# Patient Record
Sex: Male | Born: 1945 | Race: White | Hispanic: No | Marital: Married | Smoking: Former smoker
Health system: Southern US, Community
[De-identification: ages and names within clinical notes are randomized; demographics above are authoritative.]

## PROBLEM LIST (undated history)

## (undated) DIAGNOSIS — H6990 Unspecified Eustachian tube disorder, unspecified ear: Secondary | ICD-10-CM

## (undated) DIAGNOSIS — N5089 Other specified disorders of the male genital organs: Secondary | ICD-10-CM

## (undated) DIAGNOSIS — D696 Thrombocytopenia, unspecified: Secondary | ICD-10-CM

## (undated) DIAGNOSIS — I4510 Unspecified right bundle-branch block: Secondary | ICD-10-CM

## (undated) DIAGNOSIS — Z8546 Personal history of malignant neoplasm of prostate: Secondary | ICD-10-CM

## (undated) DIAGNOSIS — J301 Allergic rhinitis due to pollen: Secondary | ICD-10-CM

## (undated) DIAGNOSIS — F419 Anxiety disorder, unspecified: Secondary | ICD-10-CM

## (undated) DIAGNOSIS — K219 Gastro-esophageal reflux disease without esophagitis: Secondary | ICD-10-CM

## (undated) DIAGNOSIS — M1990 Unspecified osteoarthritis, unspecified site: Secondary | ICD-10-CM

## (undated) DIAGNOSIS — H1010 Acute atopic conjunctivitis, unspecified eye: Secondary | ICD-10-CM

## (undated) DIAGNOSIS — Z9989 Dependence on other enabling machines and devices: Secondary | ICD-10-CM

## (undated) DIAGNOSIS — G4733 Obstructive sleep apnea (adult) (pediatric): Secondary | ICD-10-CM

## (undated) DIAGNOSIS — H6980 Other specified disorders of Eustachian tube, unspecified ear: Secondary | ICD-10-CM

## (undated) DIAGNOSIS — R059 Cough, unspecified: Secondary | ICD-10-CM

## (undated) DIAGNOSIS — R05 Cough: Secondary | ICD-10-CM

## (undated) DIAGNOSIS — K589 Irritable bowel syndrome without diarrhea: Secondary | ICD-10-CM

## (undated) HISTORY — PX: PROSTATECTOMY: SHX69

## (undated) HISTORY — DX: Other specified disorders of Eustachian tube, unspecified ear: H69.80

## (undated) HISTORY — DX: Allergic rhinitis due to pollen: J30.1

## (undated) HISTORY — DX: Acute atopic conjunctivitis, unspecified eye: H10.10

## (undated) HISTORY — DX: Personal history of malignant neoplasm of prostate: Z85.46

## (undated) HISTORY — PX: OTHER SURGICAL HISTORY: SHX169

## (undated) HISTORY — DX: Gastro-esophageal reflux disease without esophagitis: K21.9

## (undated) HISTORY — DX: Unspecified eustachian tube disorder, unspecified ear: H69.90

---

## 1999-04-17 ENCOUNTER — Other Ambulatory Visit: Admission: RE | Admit: 1999-04-17 | Discharge: 1999-04-17 | Payer: Self-pay | Admitting: Urology

## 1999-05-21 ENCOUNTER — Encounter (INDEPENDENT_AMBULATORY_CARE_PROVIDER_SITE_OTHER): Payer: Self-pay

## 1999-05-21 ENCOUNTER — Inpatient Hospital Stay (HOSPITAL_COMMUNITY): Admission: RE | Admit: 1999-05-21 | Discharge: 1999-05-24 | Payer: Self-pay | Admitting: Urology

## 2000-01-21 ENCOUNTER — Encounter: Payer: Self-pay | Admitting: General Surgery

## 2000-01-24 ENCOUNTER — Encounter (INDEPENDENT_AMBULATORY_CARE_PROVIDER_SITE_OTHER): Payer: Self-pay | Admitting: Specialist

## 2000-01-24 ENCOUNTER — Observation Stay (HOSPITAL_COMMUNITY): Admission: RE | Admit: 2000-01-24 | Discharge: 2000-01-25 | Payer: Self-pay | Admitting: General Surgery

## 2000-01-24 HISTORY — PX: LAPAROSCOPIC CHOLECYSTECTOMY: SUR755

## 2001-05-28 ENCOUNTER — Ambulatory Visit (HOSPITAL_COMMUNITY): Admission: RE | Admit: 2001-05-28 | Discharge: 2001-05-28 | Payer: Self-pay | Admitting: *Deleted

## 2004-03-12 ENCOUNTER — Encounter (INDEPENDENT_AMBULATORY_CARE_PROVIDER_SITE_OTHER): Payer: Self-pay | Admitting: *Deleted

## 2004-03-12 ENCOUNTER — Ambulatory Visit (HOSPITAL_COMMUNITY): Admission: RE | Admit: 2004-03-12 | Discharge: 2004-03-12 | Payer: Self-pay | Admitting: *Deleted

## 2004-11-21 ENCOUNTER — Ambulatory Visit: Payer: Self-pay | Admitting: Internal Medicine

## 2005-03-19 ENCOUNTER — Ambulatory Visit: Payer: Self-pay | Admitting: Internal Medicine

## 2005-07-18 ENCOUNTER — Ambulatory Visit: Payer: Self-pay | Admitting: Internal Medicine

## 2005-08-16 ENCOUNTER — Ambulatory Visit: Payer: Self-pay | Admitting: Internal Medicine

## 2005-11-13 ENCOUNTER — Ambulatory Visit: Payer: Self-pay | Admitting: Internal Medicine

## 2006-03-19 ENCOUNTER — Ambulatory Visit: Payer: Self-pay | Admitting: Internal Medicine

## 2006-07-02 ENCOUNTER — Ambulatory Visit: Payer: Self-pay | Admitting: Internal Medicine

## 2006-08-15 ENCOUNTER — Ambulatory Visit: Payer: Self-pay | Admitting: Internal Medicine

## 2006-10-30 ENCOUNTER — Ambulatory Visit: Payer: Self-pay | Admitting: Internal Medicine

## 2007-02-24 ENCOUNTER — Ambulatory Visit: Payer: Self-pay | Admitting: Internal Medicine

## 2007-06-13 DIAGNOSIS — H6980 Other specified disorders of Eustachian tube, unspecified ear: Secondary | ICD-10-CM | POA: Insufficient documentation

## 2007-06-13 DIAGNOSIS — I491 Atrial premature depolarization: Secondary | ICD-10-CM | POA: Insufficient documentation

## 2007-06-13 DIAGNOSIS — J301 Allergic rhinitis due to pollen: Secondary | ICD-10-CM | POA: Insufficient documentation

## 2007-06-13 DIAGNOSIS — C61 Malignant neoplasm of prostate: Secondary | ICD-10-CM | POA: Insufficient documentation

## 2007-06-13 DIAGNOSIS — H1045 Other chronic allergic conjunctivitis: Secondary | ICD-10-CM | POA: Insufficient documentation

## 2007-06-13 DIAGNOSIS — Z9079 Acquired absence of other genital organ(s): Secondary | ICD-10-CM | POA: Insufficient documentation

## 2007-06-13 DIAGNOSIS — G4733 Obstructive sleep apnea (adult) (pediatric): Secondary | ICD-10-CM | POA: Insufficient documentation

## 2007-06-15 ENCOUNTER — Ambulatory Visit: Payer: Self-pay | Admitting: Internal Medicine

## 2007-08-14 ENCOUNTER — Ambulatory Visit: Payer: Self-pay | Admitting: Internal Medicine

## 2007-10-07 ENCOUNTER — Ambulatory Visit: Payer: Self-pay | Admitting: Internal Medicine

## 2008-01-20 ENCOUNTER — Ambulatory Visit: Payer: Self-pay | Admitting: Internal Medicine

## 2008-05-05 ENCOUNTER — Ambulatory Visit: Payer: Self-pay | Admitting: Internal Medicine

## 2008-08-12 ENCOUNTER — Ambulatory Visit: Payer: Self-pay | Admitting: Internal Medicine

## 2008-08-12 DIAGNOSIS — K219 Gastro-esophageal reflux disease without esophagitis: Secondary | ICD-10-CM | POA: Insufficient documentation

## 2008-08-15 ENCOUNTER — Telehealth: Payer: Self-pay | Admitting: Internal Medicine

## 2008-08-16 ENCOUNTER — Ambulatory Visit: Payer: Self-pay | Admitting: Internal Medicine

## 2008-12-21 ENCOUNTER — Ambulatory Visit: Payer: Self-pay | Admitting: Internal Medicine

## 2009-05-01 ENCOUNTER — Ambulatory Visit: Payer: Self-pay | Admitting: Internal Medicine

## 2009-08-10 ENCOUNTER — Ambulatory Visit: Payer: Self-pay | Admitting: Internal Medicine

## 2009-08-29 ENCOUNTER — Ambulatory Visit: Payer: Self-pay | Admitting: Internal Medicine

## 2009-12-20 ENCOUNTER — Ambulatory Visit: Payer: Self-pay | Admitting: Internal Medicine

## 2010-04-25 ENCOUNTER — Ambulatory Visit: Payer: Self-pay | Admitting: Internal Medicine

## 2010-08-09 ENCOUNTER — Ambulatory Visit: Payer: Self-pay | Admitting: Internal Medicine

## 2010-08-28 ENCOUNTER — Ambulatory Visit: Payer: Self-pay | Admitting: Internal Medicine

## 2010-10-02 NOTE — Assessment & Plan Note (Signed)
Summary: rov 1 yr ///kp   Primary Renda Pohlman/Referring Charliegh Vasudevan:  Renne Crigler  CC:  Yearly follow up visit-allergies; no complaints..  History of Present Illness:  08/12/08 -  Allergic rhinitis/conjunctivitis, OSA Once or twice daily needs benadryl to stop seasonal rhinitis not adequately responsive to Allegra. Discussed alternatives.Never asthma.Occ minor self limited cough. On nexium.  August 10, 2009-  allergic rhinits/ conjunctivitis, OSA He had to resume flonase with seasonal rhinitis. He continues allergy vaccine. We discussed as needed use of Alllegra. He continues excellent compliance and control with CPAP at 5, which helps. Denies stuffiness and says he's fine today.    August 09, 2010  allergic rhinits/ conjunctivitis, OSA Nurse-CC: Yearly follow up visit-allergies; no complaints. He is satisfied with the way he has done over the past year. It has helped to use flonase more regularly. Denies major health events in past year.  He continues to use CPAP every night with good control. Dryness of the season does give a little nasal crusting.    Preventive Screening-Counseling & Management  Alcohol-Tobacco     Smoking Status: quit     Passive Smoke Exposure: no  Current Medications (verified): 1)  Flonase 50 Mcg/act  Susp (Fluticasone Propionate) .... As Needed 2)  Allegra 60 Mg  Tabs (Fexofenadine Hcl) .... Use As Needed Two Times A Day 3)  Epipen 2-Pak 0.3 Mg/0.89ml (1:1000)  Devi (Epinephrine Hcl (Anaphylaxis)) .... As Needed For Allergic Reaction 4)  Cpap - 5 Cwp 5)  Allergy Vaccine 1:10 Go (W-E) 6)  Toprol Xl 50 Mg Xr24h-Tab (Metoprolol Succinate) .... Take 1 By Mouth Once Daily 7)  Lipitor 10 Mg Tabs (Atorvastatin Calcium) .... Take 1 By Mouth Once Daily 8)  Nexium 40 Mg Cpdr (Esomeprazole Magnesium) .... Take 1 By Mouth Once Daily 9)  Cephalexin 500 Mg Caps (Cephalexin) .Marland Kitchen.. 1 Four Times A Day 10)  Zoloft 100 Mg Tabs (Sertraline Hcl) .... Take 1 By Mouth Once Daily With  A 50mg  Tablet 11)  Zoloft 50 Mg Tabs (Sertraline Hcl) .... Take 1 By Mouth Once Daily 12)  Aspirin 81 Mg Tbec (Aspirin) .... Take 1 By Mouth Once Daily 13)  Fish Oil 1000 Mg Caps (Omega-3 Fatty Acids) .... Take 3 By Mouth Once Daily  Allergies (verified): 1)  ! * Vantin 2)  ! Darvocet 3)  ! Floxin 4)  ! 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  Quit 03-14-69. Negative history of passive tobacco smoke exposure.  Exercise-6 days week Caffeine-no ETOH-1-2 daily Married with 3 children.  Risk Factors: Smoking Status: quit (08/09/2010) Passive Smoke Exposure: no (08/09/2010)  Review of Systems      See HPI  The patient denies shortness of breath with activity, shortness of breath at rest, productive cough, non-productive cough, coughing up blood, chest pain, irregular heartbeats, acid heartburn, indigestion, loss of appetite, weight change, abdominal pain, difficulty swallowing, sore throat, tooth/dental problems, headaches, nasal congestion/difficulty breathing through nose, and sneezing.    Vital Signs:  Patient profile:  65 year old male Weight:      203.50 pounds O2 Sat:      97 % on Room air Pulse rate:   61 / minute BP sitting:   104 / 62  (left arm) Cuff size:   regular  Vitals Entered By: Reynaldo Minium CMA (August 09, 2010 10:20 AM)  O2 Flow:  Room air CC:  Yearly follow up visit-allergies; no complaints.   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 We had update discussion of risk and benefit.  Problem # 2:  OBSTRUCTIVE SLEEP APNEA (ICD-327.23) His compliance and control with CPAP.  Medications Added to Medication List This Visit: 1)  Cpap - 5 Cwp [facility]  2)  Nexium 40 Mg Cpdr (Esomeprazole magnesium) .... Take 1 by mouth once daily 3)  Aspirin 81 Mg Tbec (Aspirin) .... Take 1 by mouth once daily 4)  Fish Oil 1000 Mg Caps (Omega-3 fatty acids) .... Take 3 by mouth once daily  Other Orders: Est. Patient Level IV (95284)  Patient Instructions: 1)  Please schedule a follow-up appointment in 1 year. 2)  Scripts sent to [facility] Prescriptions: CEPHALEXIN 500 MG CAPS (CEPHALEXIN) 1 four times a day  #28 x 1   Entered by:   Boone Master CNA/MA   Authorized by:   Waymon Budge MD   Signed by:   Boone Master CNA/MA on 08/09/2010   Method used:   Electronically to        [facility] [phone][facility] (retail)       [address]       [place], [place]  [address]       Ph: [phone] or [phone]       Fax: [phone]   RxID:   [id number] EPIPEN 2-PAK 0.3 MG/0.3ML (1:1000)  DEVI (EPINEPHRINE HCL (ANAPHYLAXIS)) as needed FOR ALLERGIC REACTION  #1 x prn   Entered by:   Boone Master CNA/MA   Authorized by:   Waymon Budge MD   Signed by:    Boone Master CNA/MA on 08/09/2010   Method used:   Electronically to        [facility] [phone]* (retail)       [address]       [place], [place]  [address]       Ph: [phone] or [phone]       Fax: [phone]   RxID:   [id number] FLONASE 50 MCG/ACT  SUSP (FLUTICASONE PROPIONATE) as needed  #1 x prn   Entered by:   Boone Master CNA/MA   Authorized by:   Waymon Budge MD   Signed by:   Boone Master CNA/MA on 08/09/2010   Method used:   Electronically to        [facility] [phone]* (retail)       [address]       [place], [place]  [address]  Ph: [phone] or [phone]       Fax: [phone]   RxID:   [id number] CEPHALEXIN 500 MG CAPS (CEPHALEXIN) 1 four times a day  #28 x 1   Entered and Authorized by:   Waymon Budge MD   Signed by:   Waymon Budge MD on 08/09/2010   Method used:   Electronically to        [facility] [phone]* (retail)       [address]       [place], [place]  [address]       Ph: [phone] or [phone]       Fax: [phone]   RxID:   [id number] FLONASE 50 MCG/ACT  SUSP (FLUTICASONE PROPIONATE) as needed  #1 x prn   Entered and Authorized by:   Waymon Budge MD   Signed by:   Waymon Budge MD on 08/09/2010   Method used:   Electronically to        [facility] [phone]* (retail)       [address]       [place], [place]  [address]       Ph: [phone] or [phone]       Fax: [phone]   RxID:   [id number] EPIPEN 2-PAK 0.3 MG/0.3ML (1:1000)  DEVI (EPINEPHRINE HCL (ANAPHYLAXIS)) as needed FOR ALLERGIC REACTION  #1 x prn   Entered and Authorized by:   Waymon Budge MD   Signed by:   Waymon Budge MD on 08/09/2010   Method used:   Electronically to        [facility] [phone]* (retail)       [address]       [place], [place]  [address]       Ph: [phone] or [phone]       Fax: [phone]   RxID:   [id number] CEPHALEXIN 500 MG CAPS  (CEPHALEXIN) 1 four times a day  #28 x 1   Entered and Authorized by:   Waymon Budge MD   Signed by:   Waymon Budge MD on 08/09/2010   Method used:   [facility]ronically to        [phone] ([facility])       [address]       [place], [place]  [address]       Ph: [phone] or [phone]       Fax: [phone]   RxID:   [id number] EPIPEN 2-PAK 0.3 MG/0.3ML (1:1000)  DEVI (EPINEPHRINE HCL (ANAPHYLAXIS)) as needed FOR ALLERGIC REACTION  #1 x prn   Entered and Authorized by:   Waymon Budge MD   Signed by:   Waymon Budge MD on 08/09/2010   Method used:   Electronically to        [phone] ([facility]ail)       [address]       [place], [place]  [address]       Ph: [phone] or [phone]       Fax: [phone]   RxID:   [id number] FLONASE 50 MCG/ACT  SUSP (FLUTICASONE PROPIONATE) as needed  #1 x prn   Entered and Authorized by:   Waymon Budge MD   Signed by:   Waymon Budge MD on 08/09/2010   Method used:   Electronically to        [facility] [phone]* (retail)  [address]       [place], [place]  [address]       Ph: [phone] or [phone]       Fax: [phone]   RxID:   [id number]

## 2010-12-24 ENCOUNTER — Ambulatory Visit (INDEPENDENT_AMBULATORY_CARE_PROVIDER_SITE_OTHER): Payer: Self-pay

## 2010-12-24 DIAGNOSIS — J309 Allergic rhinitis, unspecified: Secondary | ICD-10-CM

## 2011-01-18 NOTE — H&P (Signed)
[facility]  Patient:    [name], COLUCCIO                       MRN: [id number] Adm. Date:  [id number] Attending:  Arlis Porta CC:         Soyla Murphy. Renne Crigler, M.D.             Veverly Fells. Vernie Ammons, M.D.                         History and Physical  REASON FOR ADMISSION:  Elective cholecystectomy.  HISTORY OF PRESENT ILLNESS:  This is a 65 year old male who had been having some pain in his back and his costovertebral angle regions. Dr. Veverly Fells. Ottelin performed an ultrasound of the kidneys which looked normal but he noted multiple gallstones.  At that time, he did not appear to be symptomatic when I saw him in the office; however, in early to mid April, he began having attacks of biliary colic, one fairly severe, and then other minor attacks.  Because he has known gallstones, he came back to the office and we scheduled him to have a laparoscopic cholecystectomy.  PAST MEDICAL HISTORY: 1. Prostate cancer. 2. Irritable bowel syndrome history. 3. History of panic attacks.  PREVIOUS OPERATIONS:  Radical prostatectomy; removal of lipomas.  ALLERGIES:  PENICILLIN, DOXYCYCLINE, VANTIN, MACRODANTIN, SEPTRA, TRIMETHOPRIM, FLOXIN, LEVAQUIN, E-MYCIN, DARVOCET-N, NIZORAL.  MEDICATIONS: 1. Valium 5 mg twice a day. 2. Vitamin E 400 units daily. 3. Centrum multivitamin. 4. Toprol XL 50 mg q.d. 5. Viagra p.r.n. 6. Levsin p.r.n. 7. Vioxx p.r.n.  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  No tobacco use. He occasionally has an alcoholic beverage.  FAMILY HISTORY:  Positive for mother with hypertension and coronary artery disease.  REVIEW OF SYSTEMS:  Pertinent positives include a rare headache.  He has occasional allergies and allergic rhinitis.  He states he has an anal fissure and intermittently has to take Metamucil.  PHYSICAL EXAMINATION:  GENERAL:  A thin male in no acute distress, very pleasant and cooperative. Afebrile.  VITAL  SIGNS:  Normal.  SKIN:  Warm and dry with no jaundice.  HEENT:  Eyes:  Extraocular motions intact.  Sclerae are clear.  NECK:  Supple without masses.  CARDIOVASCULAR:  Heart demonstrates a regular rate and rhythm without a murmur.  RESPIRATORY:  Breath sounds equal and clear.  Respirations unlabored.  ABDOMEN:  Soft, nontender.  There is a well-healed midline scar.  No palpable masses or hernia defects.  EXTREMITIES:  No edema.  IMPRESSION:  Symptomatic cholelithiasis and likely chronic cholecystitis.  PLAN:  Laparoscopic cholecystectomy.  The procedure and risks have been explained to him. DD:  01/24/00 TD:  01/24/00 Job: [id number]

## 2011-01-18 NOTE — Op Note (Signed)
[facility]  Patient:    [name], [name]                       MRN: [id number] Proc. Date: 01/24/00 Adm. Date:  [id number] Disc. Date: [id number] Attending:  Arlis Porta CC:         Veverly Fells. Vernie Ammons, M.D.             Soyla Murphy. Renne Crigler, M.D.                           Operative Report  PREOPERATIVE DIAGNOSIS:  Symptomatic cholecystitis.  POSTOPERATIVE DIAGNOSIS:  Chronic calculous cholecystitis.  PROCEDURE:  Laparoscopic cholecystectomy.  SURGEON:  Adolph Pollack, M.D.  ASSISTANT:  Sheppard Plumber. Earlene Plater, M.D.  ANESTHESIA:  General.  INDICATIONS:  This 65 year old male was found to have gallstones incidentally on a renal ultrasound.  He began having biliary colic in early April and now presents for laparoscopic cholecystectomy.  Preoperatively, his liver function tests are normal.  DESCRIPTION OF PROCEDURE:  He was placed supine on the operating table, and general anesthetic was administered.  The abdomen was sterilely prepped and draped.  A subumbilical incision was made after infiltrating 0.5% plain Marcaine.  The skin was incised sharply.  The fascia was identified and a 1 cm incision made in the fascia.  The peritoneal cavity was entered sharply and under direct vision.  A pursestring suture of 0 Vicryl was placed around the fascial edges.  The Hasson trocar was introduced into the peritoneal cavity and a pneumoperitoneum created by insufflation of CO2 gas.  Next, he was placed in appropriate position and the 11 mm trocar was placed through an epigastric incision and two 5 mm trocars placed through 5 mm incisions in the right abdomen.  The fundus of the gallbladder was identified and grasped, and there were omental adhesions to the fundus, body, and infundibulum.  There were also some duodenal adhesions, and these were all taken down bluntly.  I was able to subsequently retract the fundus toward the right shoulder and pull the infundibulum  laterally.  This allowed me to identify the cystic duct.  I skeletonized this and also skeletonized the cystic artery.  I was able to observe the common bile duct.  This did not appear to be dilated.  Because of his normal liver function tests and nondilated common bile duct, I decided he did not need a cholangiogram.  I clipped the cystic duct three times proximally, once distally, and divided it sharply.  I then clipped the cystic artery twice proximally and once distally and divided it sharply.  I dissected the gallbladder free from the liver bed using the cautery.  Bleeding points were controlled with the cautery.  I then irrigated the gallbladder fossa copiously and did not notice any further bleeding or bile leakage.  I evacuated the fluid.  The gallbladder was removed from the subumbilical port and was found to be full of multiple stones, most of which measured 5 mm or greater.  The gallbladder was thickened.  It was sent to pathology.  Again, the perihepatic area was inspected, and no bleeding or bile leak was noted.  All trocars were removed, and pneumoperitoneum was released.  The subumbilical fascial defect was closed by tightening up and tying down the pursestring suture.  The skin incisions were closed with 4-0 Monocryl subcuticular stitches, followed by Steri-Strips and sterile dressings.  He tolerated the procedure  well without any apparent complications.  He was taken to the recovery room in satisfactory condition. DD:  01/24/00 TD:  01/28/00 Job: [id number]

## 2011-01-18 NOTE — Assessment & Plan Note (Signed)
[name]                             PULMONARY OFFICE NOTE   NAME:[name], [name]                       MRN:          [id number]  DATE:08/15/2006                            DOB:          1945-12-13    PULMONARY/ALLERGY FOLLOWUP:   PROBLEM:  1. Allergic rhinitis.  2. Allergic conjunctivitis.  3. Eustachian dysfunction.  4. History of premature atrial contraction.  5. Obstructive sleep apnea.  6. History of prostate cancer/radical prostatectomy.   HISTORY:  One year followup.  With dry weather he has had increased  episodic nasal congestion.  Especially raking leaves this fall without a  mask, he noted nasal congestion, postnasal drip and sneezing.  He  recently resumed Flonase because of this.  He has had no chest symptoms.   MEDICATIONS:  1. Allergy vaccine continues at 1 to 10 with injections given by his      wife with no problems.  2. Flonase.  3. Allegra 60 mg b.i.d. p.r.n.  4. Keflex 500 mg q.i.d., which he takes appropriately at rare      intervals p.r.n. as discussed.  5. He has an Epi Pen.  6. Continues a CPAP at 5 CWP with no problems.  7. Occasional Benadryl.   DRUG INTOLERANCE:  1. PENICILLIN.  2. MACRODANTIN.  3. DOXYCYCLINE.  4. SEPTRA.  5. HE AVOIDS SUDAFED.   OBJECTIVE:  Weight 200 pounds.  BP 104/68.  Pulse regular at 56.  Room  air saturation 99%.  There is moderate turbinate edema with clear mucus bridging.  No visible  polyps and no postnasal drip or erythema.  Conjunctivae are clear.  CHEST:  Quiet and clear.  Breathing unlabored.  Pulse regular and normal.   IMPRESSION:  Mild seasonal flare of allergic rhinitis.   PLAN:  We reviewed options and refilled medications.  I spent time today  discussing risk and benefit of allergy vaccine, issues of administration  outside of a medical office, anaphylaxis and  epinephrine.  He is going to try samples of Xyzal 5 mg daily p.r.n.  We  refilled his Epi Pen with  discussion.  Refilled routine meds.  Schedule  return in 1 year, earlier p.r.n.     Clinton D. Maple Hudson, MD, Tonny Bollman, FACP  Electronically Signed    CDY/MedQ  DD: 08/16/2006  DT: 08/16/2006  Job #: [id number]   cc:   Soyla Murphy. Renne Crigler, M.D.

## 2011-01-18 NOTE — Procedures (Signed)
[facility]  Patient:    [name], [name] Visit Number: [id number] MRN: [id number]          Service Type: END Location: ENDO Attending Physician:  Sabino Gasser Proc. Date: 05/28/01 Admit Date:  05/28/2001                             Procedure Report  PROCEDURE:  Colonoscopy.  INDICATIONS:  Rectal bleeding.  ANESTHESIA:  Demerol 120, Versed 12 mg.  DESCRIPTION OF PROCEDURE:  With the patient mildly sedated in the left lateral decubitus position, subsequently rolled to his back and with pressure applied to the abdomen, the Olympus videoscopic colonoscope was inserted in the rectum and passed under direct vision to the cecum, after a normal rectal disease. The cecum identified by the ileocecal valve and appendiceal orifice and after clearing the cecum of fecal debris as best we could, the colonoscope was slowly withdrawn taking circumferential views of the entire colonic mucosa. We had entered the terminal ileum as well.  This was done until we reached the rectum which appeared normal on direct and showed internal hemorrhoids that were somewhat inflamed on retroflex view.  The endoscope was straightened and withdrawn.  The patients vital signs and pulse oximeter remained stable.  The patient tolerated the procedure well without apparent complications.  FINDINGS:  Internal hemorrhoids, inflamed.  Otherwise unremarkable examination.  PLAN:  Follow up with me as needed. Attending Physician:  Sabino Gasser DD:  05/28/01 TD:  05/28/01 Job: [id number]

## 2011-01-18 NOTE — Op Note (Signed)
NAME:  [name], RINGER NO.:  [id number]   MEDICAL RECORD NO.:  [id number]                   PATIENT TYPE:  AMB   LOCATION:  ENDO                                 FACILITY:  [facility]   PHYSICIAN:  Georgiana Spinner, M.D.                 DATE OF BIRTH:  08/06/1946   DATE OF PROCEDURE:  03/12/2004  DATE OF DISCHARGE:                                 OPERATIVE REPORT   PROCEDURE:  Upper endoscopy with biopsy.   INDICATIONS:  Dysphagia.   ANESTHESIA:  Demerol 100 mg, Versed 10 mg.   DESCRIPTION OF PROCEDURE:  With the patient mildly sedated in the left  lateral decubitus position, the Olympus videoscopic endoscope was inserted  in the mouth and passed under direct vision through the esophagus, which  appeared normal, except for an area of esophagitis with ulcer that was  photographed only.  We entered into the stomach.  The  fundus, body, antrum,  duodenal bulb, and second portion of the duodenum were visualized.  From  this point, the endoscope was slowly withdrawn taking circumferential views  of the duodenal mucosa until the endoscope was pulled back into the stomach,  placed in retroflexion and viewed the stomach from below.  The endoscope was  then straightened and withdrawn, taking circumferential views of the  remaining gastric and esophageal mucosa, stopping  in the body and fundus of  the stomach where a polyp was seen and biopsied.  The patient's vital signs  and pulse oximetry remained stable.  The patient tolerated the procedure  well without apparent complication.   FINDINGS:  1. Changes of mild duodenitis with a small ulceration distally just above     the gastroesophageal junction.  2.  Polyps of the stomach biopsied.  2. 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   PLAN:  The patient did not tolerate Nexium so we will try a new PPI and see  how his dysphagia responds to PPI therapy.  We  will have him follow up with  me as an outpatient.                                               Georgiana Spinner, M.D.    GMO/MEDQ  D:  03/12/2004  T:  03/12/2004  Job:  [id number]

## 2011-04-16 ENCOUNTER — Ambulatory Visit (INDEPENDENT_AMBULATORY_CARE_PROVIDER_SITE_OTHER): Payer: PRIVATE HEALTH INSURANCE

## 2011-04-16 DIAGNOSIS — J309 Allergic rhinitis, unspecified: Secondary | ICD-10-CM

## 2011-08-07 ENCOUNTER — Ambulatory Visit (INDEPENDENT_AMBULATORY_CARE_PROVIDER_SITE_OTHER): Payer: PRIVATE HEALTH INSURANCE

## 2011-08-07 ENCOUNTER — Encounter: Payer: Self-pay | Admitting: Internal Medicine

## 2011-08-07 DIAGNOSIS — J309 Allergic rhinitis, unspecified: Secondary | ICD-10-CM

## 2011-08-08 ENCOUNTER — Ambulatory Visit (INDEPENDENT_AMBULATORY_CARE_PROVIDER_SITE_OTHER): Payer: PRIVATE HEALTH INSURANCE | Admitting: Internal Medicine

## 2011-08-08 ENCOUNTER — Encounter: Payer: Self-pay | Admitting: Internal Medicine

## 2011-08-08 VITALS — BP 112/72 | HR 59 | Ht 74.0 in | Wt 198.2 lb

## 2011-08-08 DIAGNOSIS — J301 Allergic rhinitis due to pollen: Secondary | ICD-10-CM

## 2011-08-08 DIAGNOSIS — G4733 Obstructive sleep apnea (adult) (pediatric): Secondary | ICD-10-CM

## 2011-08-08 MED ORDER — CEPHALEXIN 500 MG PO CAPS: 500.0000 mg | ORAL_CAPSULE | Freq: Four times a day (QID) | ORAL | Status: DC

## 2011-08-08 MED ORDER — EPINEPHRINE 0.3 MG/0.3ML IJ DEVI: 0.3000 mg | Freq: Once | INTRAMUSCULAR | Status: DC | PRN

## 2011-08-08 NOTE — Progress Notes (Signed)
08/08/11- 65 yoM former smoker followed for allergic rhinitis/conjunctivitis, obstructive sleep apnea LOV-08/09/2010 Has had flu vaccine. Continues to do well with allergy shots. Uses Flonase when needed. Continues CPAP at 5/[facility]. He uses it all night every night with no problems. His machine is about 65 years old and we discussed process of replacement when needed.  ROS-see HPI Constitutional:   No-   weight loss, night sweats, fevers, chills, fatigue, lassitude.  HEENT:   No-  headaches, difficulty swallowing, tooth/dental problems, sore throat,       No-  sneezing, itching, ear ache, nasal congestion, post nasal drip,  CV:  No-   chest pain, orthopnea, PND, swelling in lower extremities, anasarca,                                  dizziness, palpitations Resp: No-   shortness of breath with exertion or at rest.              No-   productive cough,  No non-productive cough,  No- coughing up of blood.              No-   change in color of mucus.  No- wheezing.   Skin: No-   . GI:  No-    GU: No- MS:  No. Neuro-     nothing unusual Psych:  No- change in mood or affect. No depression or anxiety.  No memory loss.  OBJ General- Alert, Oriented, Affect-appropriate, Distress- none acute, medium build Skin- rash-none, lesions- none, excoriation- none Lymphadenopathy- none Head- atraumatic            Eyes- Gross vision intact, PERRLA, conjunctivae clear secretions            Ears- Hearing, canals-normal            Nose- minor nasal crusting, no-Septal dev, mucus, polyps, erosion, perforation             Throat- Mallampati II , mucosa clear , drainage- none, tonsils- atrophic Neck- flexible , trachea midline, no stridor , thyroid nl, carotid no bruit Chest - symmetrical excursion , unlabored           Heart/CV- RRR , no murmur , no gallop  , no rub, nl s1 s2                           - JVD- none , edema- none, stasis changes- none, varices- none           Lung- clear to P&A,  wheeze- none, cough- none , dullness-none, rub- none           Chest wall-  Abd- tender-no, distended-no, bowel sounds-present, HSM- no Br/ Gen/ Rectal- Not done, not indicated Extrem- cyanosis- none, clubbing, none, atrophy- none, strength- nl Neuro- grossly intact to observation

## 2011-08-08 NOTE — Patient Instructions (Signed)
Epipen refilled  Continue allergy vaccine   Please call as needed  Continue CPAP at 5

## 2011-08-11 NOTE — Assessment & Plan Note (Signed)
We reviewed risk benefit issues again. Refill EpiPen.

## 2011-08-11 NOTE — Assessment & Plan Note (Signed)
Good CPAP compliance and control. Weight is good. No changes are needed.

## 2011-10-30 ENCOUNTER — Other Ambulatory Visit: Payer: Self-pay | Admitting: Internal Medicine

## 2011-12-05 ENCOUNTER — Ambulatory Visit (INDEPENDENT_AMBULATORY_CARE_PROVIDER_SITE_OTHER): Payer: PRIVATE HEALTH INSURANCE

## 2011-12-05 DIAGNOSIS — J309 Allergic rhinitis, unspecified: Secondary | ICD-10-CM

## 2012-01-26 ENCOUNTER — Other Ambulatory Visit: Payer: Self-pay | Admitting: Internal Medicine

## 2012-03-18 ENCOUNTER — Ambulatory Visit (INDEPENDENT_AMBULATORY_CARE_PROVIDER_SITE_OTHER): Payer: PRIVATE HEALTH INSURANCE

## 2012-03-18 DIAGNOSIS — J309 Allergic rhinitis, unspecified: Secondary | ICD-10-CM

## 2012-07-29 ENCOUNTER — Ambulatory Visit (INDEPENDENT_AMBULATORY_CARE_PROVIDER_SITE_OTHER): Payer: PRIVATE HEALTH INSURANCE

## 2012-07-29 DIAGNOSIS — J309 Allergic rhinitis, unspecified: Secondary | ICD-10-CM

## 2012-08-05 ENCOUNTER — Ambulatory Visit: Payer: PRIVATE HEALTH INSURANCE | Admitting: Internal Medicine

## 2012-08-10 ENCOUNTER — Ambulatory Visit (INDEPENDENT_AMBULATORY_CARE_PROVIDER_SITE_OTHER): Payer: PRIVATE HEALTH INSURANCE | Admitting: Internal Medicine

## 2012-08-10 ENCOUNTER — Encounter: Payer: Self-pay | Admitting: Internal Medicine

## 2012-08-10 VITALS — BP 122/70 | HR 55 | Ht 74.0 in | Wt 188.8 lb

## 2012-08-10 DIAGNOSIS — J301 Allergic rhinitis due to pollen: Secondary | ICD-10-CM

## 2012-08-10 DIAGNOSIS — G4733 Obstructive sleep apnea (adult) (pediatric): Secondary | ICD-10-CM

## 2012-08-10 MED ORDER — CEPHALEXIN 500 MG PO CAPS: 500.0000 mg | ORAL_CAPSULE | Freq: Four times a day (QID) | ORAL | Status: DC

## 2012-08-10 MED ORDER — FLUTICASONE PROPIONATE 50 MCG/ACT NA SUSP: 2.0000 | Freq: Every day | NASAL | Status: DC

## 2012-08-10 MED ORDER — EPINEPHRINE 0.3 MG/0.3ML IJ DEVI: 0.3000 mg | Freq: Once | INTRAMUSCULAR | Status: AC | PRN

## 2012-08-10 NOTE — Progress Notes (Signed)
08/08/11- 65 yoM former smoker followed for allergic rhinitis/conjunctivitis, obstructive sleep apnea LOV-08/09/2010 Has had flu vaccine. Continues to do well with allergy shots. Uses Flonase when needed. Continues CPAP at 5/[facility]. He uses it all night every night with no problems. His machine is about 66 years old and we discussed process of replacement when needed.  08/10/12- 65 yoM former smoker followed for allergic rhinitis/conjunctivitis, obstructive sleep apnea FOLLOWS FOR: wears CPAP 5/[facility] with nasal pillows mask every night for about 6 hours and no troubles with pressure; sleeps well. still on allergy vaccine 1:10 GO and doing well. Rare rhinitis flare. Flonase usually works. Occasionally needs Benadryl.  ROS-see HPI Constitutional:   No-   weight loss, night sweats, fevers, chills, fatigue, lassitude. HEENT:   No-  headaches, difficulty swallowing, tooth/dental problems, sore throat,       No- current sneezing, itching, ear ache, nasal congestion, post nasal drip,  CV:  No-   chest pain, orthopnea, PND, swelling in lower extremities, anasarca, dizziness, palpitations Resp: No-   shortness of breath with exertion or at rest.              No-   productive cough,  No non-productive cough,  No- coughing up of blood.              No-   change in color of mucus.  No- wheezing.   Skin: No-   rash or lesions. GI:  No-   heartburn, indigestion, abdominal pain, nausea, vomiting,  GU:  MS:  No-   joint pain or swelling.   Neuro-     nothing unusual Psych:  No- change in mood or affect. No depression or anxiety.  No memory loss.   OBJ General- Alert, Oriented, Affect-appropriate, Distress- none acute, medium build Skin- rash-none, lesions- none, excoriation- none Lymphadenopathy- none Head- atraumatic            Eyes- Gross vision intact, PERRLA, conjunctivae clear secretions            Ears- Hearing, canals-normal            Nose- minor nasal crusting,  no-Septal dev, mucus, polyps, erosion, perforation             Throat- Mallampati II , mucosa clear , drainage- none, tonsils- atrophic Neck- flexible , trachea midline, no stridor , thyroid nl, carotid no bruit Chest - symmetrical excursion , unlabored           Heart/CV- RRR , no murmur , no gallop  , no rub, nl s1 s2                           - JVD- none , edema- none, stasis changes- none, varices- none           Lung- clear to P&A, wheeze- none, cough- none , dullness-none, rub- none           Chest wall-  Abd-  Br/ Gen/ Rectal- Not done, not indicated Extrem- cyanosis- none, clubbing, none, atrophy- none, strength- nl Neuro- grossly intact to observation

## 2012-08-10 NOTE — Patient Instructions (Addendum)
We can continue CPAP at 5 cwp/ [facility]  Script top hold for vaccine syringes  Script sent for Epipen and Flonase refills  Please call as needed

## 2012-08-16 NOTE — Assessment & Plan Note (Signed)
He feels allergy vaccine helps him, supplemented with Flonase and occasional Benadryl. We discussed Claritin as a nonsedating alternative. He asks prescription to hold for cephalexin in case needed during travel.

## 2012-08-16 NOTE — Assessment & Plan Note (Signed)
Good compliance and control. No complaints at home. He sleeps well.

## 2012-11-02 ENCOUNTER — Telehealth: Payer: Self-pay | Admitting: Internal Medicine

## 2012-11-02 MED ORDER — AZELASTINE-FLUTICASONE 137-50 MCG/ACT NA SUSP: 1.0000 | Freq: Every day | NASAL | Status: DC

## 2012-11-02 NOTE — Telephone Encounter (Signed)
He could come by for a sample of Dymista nasal spray     Try 1-2 puffs, once or twice daily

## 2012-11-02 NOTE — Telephone Encounter (Signed)
Spoke with pt and notified of recs per CDY He verbalized understanding and states nothing further needed Sample up front for pick up

## 2012-11-02 NOTE — Telephone Encounter (Signed)
I spoke with pt and he stated he has had 2 sneezing fits in the past 3 weeks. When this happens it lasts all day and his nose just runs. He uses the flonase and it doesn't help and used fexofenadine in the past and it doesn't help. Pt is on allergy vaccine. He is wanting to know if he needs to be using something different. Please advise Dr. Maple Hudson thanks Last OV 08/10/12 Pending 08/10/13 Allergies  Allergen Reactions  . Cefpodoxime Proxetil   . Doxycycline   . Nitrofurantoin   . Ofloxacin   . Penicillins   . Propoxyphene-Acetaminophen   . Pseudoephedrine     REACTION: AFFECTS BLOOD PRESSURE  . Sulfamethoxazole W-Trimethoprim

## 2012-11-11 ENCOUNTER — Telehealth: Payer: Self-pay | Admitting: Internal Medicine

## 2012-11-11 MED ORDER — AZELASTINE-FLUTICASONE 137-50 MCG/ACT NA SUSP: 1.0000 | Freq: Every day | NASAL | Status: DC

## 2012-11-11 NOTE — Telephone Encounter (Signed)
Spoke with patient, he states since sampling the Dymista he thinks it has worked very well for him. Patient would like to know if he could have a prescription for Dymista. He would also like to know if Dymista is a medication that he would need to use "year round" or just "seasonally" Last thing patient would like to know is if Dr. Maple Hudson feels he should get retested for allergies since he has had two "flare ups" in past 2 weeks?  Dr. Maple Hudson please advise, thank you  Last OV: 08/10/12 Next OV:08/10/13  Allergies  Allergen Reactions  . Cefpodoxime Proxetil   . Doxycycline   . Nitrofurantoin   . Ofloxacin   . Penicillins   . Propoxyphene-Acetaminophen   . Pseudoephedrine     REACTION: AFFECTS BLOOD PRESSURE  . Sulfamethoxazole W-Trimethoprim

## 2012-11-11 NOTE — Telephone Encounter (Signed)
Per CY-we can Rx Dymista #1 1-2 sprays in each nostril at bedtime with prn refills and we can schedule for retest of allergies as open slots available. Remind patient of no antihistamines, no OTC cough syrups, no OTC sleep aids 3 days prior to to test date. This also includes any antihistamine nasal sprays.

## 2012-11-11 NOTE — Telephone Encounter (Signed)
i spoke with pt and is aware RX has been sent. Pt is r/s for allergy testing for 02/03/13 d/t his schedule. Marland Kitchen Pt aware of allergy testing rules. Nothing further was needed

## 2012-11-12 ENCOUNTER — Ambulatory Visit (INDEPENDENT_AMBULATORY_CARE_PROVIDER_SITE_OTHER): Payer: PRIVATE HEALTH INSURANCE

## 2012-11-12 DIAGNOSIS — J309 Allergic rhinitis, unspecified: Secondary | ICD-10-CM

## 2013-02-03 ENCOUNTER — Ambulatory Visit (INDEPENDENT_AMBULATORY_CARE_PROVIDER_SITE_OTHER): Payer: PRIVATE HEALTH INSURANCE | Admitting: Internal Medicine

## 2013-02-03 ENCOUNTER — Encounter: Payer: Self-pay | Admitting: Internal Medicine

## 2013-02-03 VITALS — BP 110/62 | HR 58 | Ht 74.0 in | Wt 180.6 lb

## 2013-02-03 DIAGNOSIS — G4733 Obstructive sleep apnea (adult) (pediatric): Secondary | ICD-10-CM

## 2013-02-03 DIAGNOSIS — J301 Allergic rhinitis due to pollen: Secondary | ICD-10-CM

## 2013-02-03 DIAGNOSIS — H1045 Other chronic allergic conjunctivitis: Secondary | ICD-10-CM

## 2013-02-03 NOTE — Assessment & Plan Note (Signed)
We discussed the option to continue present vaccine or stop completely. He does not want to stop. We reviewed risk, benefits, goals, alternatives and expectations. He chooses to remix, based on new skin tests, and restart his vaccine.

## 2013-02-03 NOTE — Progress Notes (Signed)
08/08/11- 65 yoM former smoker followed for allergic rhinitis/conjunctivitis, obstructive sleep apnea LOV-08/09/2010 Has had flu vaccine. Continues to do well with allergy shots. Uses Flonase when needed. Continues CPAP at 5/[facility]. He uses it all night every night with no problems. His machine is about 67 years old and we discussed process of replacement when needed.  08/10/12- 65 yoM former smoker followed for allergic rhinitis/conjunctivitis, obstructive sleep apnea FOLLOWS FOR: wears CPAP 5/[facility] with nasal pillows mask every night for about 6 hours and no troubles with pressure; sleeps well. still on allergy vaccine 1:10 GO and doing well. Rare rhinitis flare. Flonase usually works. Occasionally needs Benadryl.  02/03/13- 65 yoM former smoker followed for allergic rhinitis/conjunctivitis, obstructive sleep apnea CPAP 5/ [facility] Dr Renne Crigler PCP Comes today to update allergy skin test Using Flonase but no routine antihistamine. Has had more rhinitis symptoms this spring. Compliance with CPAP 5/[facility] remains good. No antihistamines, OTC sleep aids, or OTC cough syrups in past 3 days. Allergy Skin Test 02/03/13-significant for weed and tree pollens, dust mite, several molds and grass pollen. We compared this to his previous vaccine with discussion. He wishes to remix and restart.  ROS-see HPI Constitutional:   No-   weight loss, night sweats, fevers, chills, fatigue, lassitude. HEENT:   No-  headaches, difficulty swallowing, tooth/dental problems, sore throat,       No- current sneezing, itching, ear ache, nasal congestion, post nasal drip,  CV:  No-   chest pain, orthopnea, PND, swelling in lower extremities, anasarca, dizziness, palpitations Resp: No-   shortness of breath with exertion or at rest.              No-   productive cough,  No non-productive cough,  No- coughing up of blood.              No-   change in color of mucus.  No-  wheezing.   Skin: No-   rash or lesions. GI:  No-   heartburn, indigestion, abdominal pain, nausea, vomiting,  GU:  MS:  No-   joint pain or swelling.   Neuro-     nothing unusual Psych:  No- change in mood or affect. No depression or anxiety.  No memory loss.   OBJ General- Alert, Oriented, Affect-appropriate, Distress- none acute, tall, thin Skin- +seborrheic keratoses on back Lymphadenopathy- none Head- atraumatic            Eyes- Gross vision intact, PERRLA, conjunctivae clear secretions            Ears- Hearing, canals-normal            Nose- +pale mucosa, no-Septal dev, mucus, polyps, erosion, perforation             Throat- Mallampati II , mucosa clear , drainage- none, tonsils- atrophic Neck- flexible , trachea midline, no stridor , thyroid nl, carotid no bruit Chest - symmetrical excursion , unlabored           Heart/CV- RRR , no murmur , no gallop  , no rub, nl s1 s2                           - JVD- none , edema- none, stasis changes- none, varices- none           Lung- clear to P&A, wheeze- none, cough- none , dullness-none, rub- none  Chest wall-  Abd-  Br/ Gen/ Rectal- Not done, not indicated Extrem- cyanosis- none, clubbing, none, atrophy- none, strength- nl Neuro- grossly intact to observation

## 2013-02-03 NOTE — Patient Instructions (Addendum)
We will remix and restart allergy vaccine to correspond to the updated skin testing  We can continue CPAP5/ [facility]

## 2013-02-03 NOTE — Assessment & Plan Note (Signed)
Good compliance and control with CPAP. No changes needed. We discussed the effect of nasal congestion on CPAP use.

## 2013-02-04 ENCOUNTER — Ambulatory Visit (INDEPENDENT_AMBULATORY_CARE_PROVIDER_SITE_OTHER): Payer: PRIVATE HEALTH INSURANCE

## 2013-02-04 DIAGNOSIS — J309 Allergic rhinitis, unspecified: Secondary | ICD-10-CM

## 2013-02-09 ENCOUNTER — Telehealth: Payer: Self-pay | Admitting: Internal Medicine

## 2013-02-09 NOTE — Telephone Encounter (Signed)
Ok for [name] to give his shots. Make sure he understands build-up and has Epipen

## 2013-02-09 NOTE — Telephone Encounter (Signed)
I called [name] to let him know his vac.was ready for him to start.(gave protocol) I also told him you had on his rx for him to get his shots here. He said "No,No, I've always given the shots myself. I've restart my shots several times I know what to do." (as far as build up goes.) Pt. Was very adament about this. Please advise.

## 2013-02-10 NOTE — Telephone Encounter (Signed)
Called pt. Made sure he remembered how to build up and yes,he does have an Epi-pen. Also reminded him the instructions are around the vial or vials. I will send this to him tomorrow. Thanks, [employer]

## 2013-02-15 ENCOUNTER — Encounter: Payer: Self-pay | Admitting: Internal Medicine

## 2013-03-29 ENCOUNTER — Telehealth: Payer: Self-pay | Admitting: *Deleted

## 2013-03-29 ENCOUNTER — Ambulatory Visit (INDEPENDENT_AMBULATORY_CARE_PROVIDER_SITE_OTHER): Payer: Medicare Other

## 2013-03-29 DIAGNOSIS — J309 Allergic rhinitis, unspecified: Secondary | ICD-10-CM

## 2013-03-29 NOTE — Telephone Encounter (Signed)
Spoke with CY about this; pt will go to 1:5000 now and he will make this vaccine up. Pt is aware that CY will make vaccine and I will mail to him. Nothing more needed at this time.

## 2013-03-29 NOTE — Telephone Encounter (Signed)
I spoke with pt. He stated he is on the 1:50000 build up vial. Today he will take 0.7 and Thursday 0.8 and then will be out. He was under the impression we would automatically send next vial to him once he is close to running out.   I spoke with Florentina Addison and is aware. Will forward to her to f/u on. Please advise thanks

## 2013-04-02 ENCOUNTER — Telehealth: Payer: Self-pay | Admitting: Internal Medicine

## 2013-04-02 NOTE — Telephone Encounter (Signed)
Pt called back and he stated that he stated that he is on 1:50,000.  Pt stated that he is out of the vaccine now and he will be going out of town on Sunday and stated that he will be out of town for 1 week.  Pt is concerned that he will not be able to take his next dose and this has not been received.  Florentina Addison stated that she mailed this out this morning.  Will forward to Miranda.

## 2013-04-02 NOTE — Telephone Encounter (Signed)
Spoke with patient-aware that his vaccine has been mailed to him. States he will miss a week and understands to continue his vaccine once he returns.

## 2013-04-02 NOTE — Telephone Encounter (Signed)
lmtcb x1 

## 2013-04-07 ENCOUNTER — Other Ambulatory Visit: Payer: Self-pay

## 2013-04-12 ENCOUNTER — Encounter: Payer: Self-pay | Admitting: Internal Medicine

## 2013-04-12 ENCOUNTER — Ambulatory Visit (INDEPENDENT_AMBULATORY_CARE_PROVIDER_SITE_OTHER): Payer: PRIVATE HEALTH INSURANCE | Admitting: Internal Medicine

## 2013-04-12 VITALS — BP 112/70 | HR 56 | Ht 74.0 in | Wt 184.4 lb

## 2013-04-12 DIAGNOSIS — J301 Allergic rhinitis due to pollen: Secondary | ICD-10-CM | POA: Diagnosis not present

## 2013-04-12 DIAGNOSIS — G4733 Obstructive sleep apnea (adult) (pediatric): Secondary | ICD-10-CM | POA: Diagnosis not present

## 2013-04-12 NOTE — Patient Instructions (Addendum)
We can continue vaccine build-up  Please call as needed

## 2013-04-12 NOTE — Progress Notes (Signed)
08/08/11- 67 yoM former smoker followed for allergic rhinitis/conjunctivitis, obstructive sleep apnea LOV-08/09/2010 Has had flu vaccine. Continues to do well with allergy shots. Uses Flonase when needed. Continues CPAP at 5/[facility]. He uses it all night every night with no problems. His machine is about 67 years old and we discussed process of replacement when needed.  08/10/12- 67 yoM former smoker followed for allergic rhinitis/conjunctivitis, obstructive sleep apnea FOLLOWS FOR: wears CPAP 5/[facility] with nasal pillows mask every night for about 6 hours and no troubles with pressure; sleeps well. still on allergy vaccine 1:10 GO and doing well. Rare rhinitis flare. Flonase usually works. Occasionally needs Benadryl.  02/03/13- 67 yoM former smoker followed for allergic rhinitis/conjunctivitis, obstructive sleep apnea CPAP 5/ [facility] Dr Renne Crigler PCP Comes today to update allergy skin test Using Flonase but no routine antihistamine. Has had more rhinitis symptoms this spring. Compliance with CPAP 5/[facility] remains good. No antihistamines, OTC sleep aids, or OTC cough syrups in past 3 days. Allergy Skin Test 02/03/13-significant for weed and tree pollens, dust mite, several molds and grass pollen. We compared this to his previous vaccine with discussion. He wishes to remix and restart.  04/12/13- 67 yoM former smoker followed for allergic rhinitis/conjunctivitis, obstructive sleep apnea CPAP 5/ [facility] Dr Renne Crigler PCP pt reports allergies doing well on vaccine--denies any other concerns at this time Continues building vaccine, now at 1:5000, giving own without problems. We reviewed risks benefits, EpiPen and protocols again. pt reports wearing CPAP 5/ [facility] every night x 6 hrs per night-- tolerating pressures fine, managed by Dr Renne Crigler.but she doesn't been low CT the CT unless he is  ROS-see HPI Constitutional:   No-    weight loss, night sweats, fevers, chills, fatigue, lassitude. HEENT:   No-  headaches, difficulty swallowing, tooth/dental problems, sore throat,       No- current sneezing, itching, ear ache, nasal congestion, post nasal drip,  CV:  No-   chest pain, orthopnea, PND, swelling in lower extremities, anasarca, dizziness, palpitations Resp: No-   shortness of breath with exertion or at rest.              No-   productive cough,  No non-productive cough,  No- coughing up of blood.              No-   change in color of mucus.  No- wheezing.   Skin: No-   rash or lesions. GI:  No-   heartburn, indigestion, abdominal pain, nausea, vomiting,  GU:  MS:  No-   joint pain or swelling.   Neuro-     nothing unusual Psych:  No- change in mood or affect. No depression or anxiety.  No memory loss.  OBJ General- Alert, Oriented, Affect-appropriate, Distress- none acute, tall, thin Skin- +seborrheic keratoses on back Lymphadenopathy- none Head- atraumatic            Eyes- Gross vision intact, PERRLA, conjunctivae clear secretions            Ears- Hearing, canals-normal            Nose- clear, no-Septal dev, mucus, polyps, erosion, perforation             Throat- Mallampati II , mucosa clear , drainage- none, tonsils- atrophic Neck- flexible , trachea midline, no stridor , thyroid nl, carotid no bruit Chest - symmetrical excursion , unlabored  Heart/CV- RRR , no murmur , no gallop  , no rub, nl s1 s2                           - JVD- none , edema- none, stasis changes- none, varices- none           Lung- clear to P&A, wheeze- none, cough- none , dullness-none, rub- none           Chest wall-  Abd-  Br/ Gen/ Rectal- Not done, not indicated Extrem- cyanosis- none, clubbing, none, atrophy- none, strength- nl Neuro- grossly intact to observation

## 2013-04-22 ENCOUNTER — Ambulatory Visit: Payer: Medicare Other

## 2013-04-22 ENCOUNTER — Ambulatory Visit (INDEPENDENT_AMBULATORY_CARE_PROVIDER_SITE_OTHER): Payer: PRIVATE HEALTH INSURANCE

## 2013-04-22 ENCOUNTER — Encounter: Payer: Self-pay | Admitting: Internal Medicine

## 2013-04-22 DIAGNOSIS — J309 Allergic rhinitis, unspecified: Secondary | ICD-10-CM | POA: Diagnosis not present

## 2013-04-22 NOTE — Telephone Encounter (Signed)
I have printed this and given it to Dimas Millin to take care of. Pt is aware. Carron Curie, CMA

## 2013-04-26 NOTE — Assessment & Plan Note (Signed)
Building allergy vaccine without problems. Environmental precautions and appropriate risk-benefit discussion.

## 2013-04-26 NOTE — Assessment & Plan Note (Signed)
Good compliance and control 

## 2013-05-21 ENCOUNTER — Encounter: Payer: Self-pay | Admitting: Internal Medicine

## 2013-05-21 NOTE — Telephone Encounter (Signed)
I have printed this message and spoken with Tammy S in allergy about this. She will take care of this. The pt has been advised via My Chart. Carron Curie, CMA

## 2013-05-24 ENCOUNTER — Ambulatory Visit (INDEPENDENT_AMBULATORY_CARE_PROVIDER_SITE_OTHER): Payer: PRIVATE HEALTH INSURANCE

## 2013-05-24 ENCOUNTER — Encounter: Payer: Self-pay | Admitting: Internal Medicine

## 2013-05-24 DIAGNOSIS — J309 Allergic rhinitis, unspecified: Secondary | ICD-10-CM | POA: Diagnosis not present

## 2013-05-27 ENCOUNTER — Encounter: Payer: Self-pay | Admitting: Internal Medicine

## 2013-05-28 DIAGNOSIS — D3130 Benign neoplasm of unspecified choroid: Secondary | ICD-10-CM | POA: Diagnosis not present

## 2013-05-28 NOTE — Telephone Encounter (Signed)
Patient e-mailed in regards to his dosing of allergy vaccine and is requesting recs on "building her dose" Please see message below from patient:    I will begin the 1:50 vials next Monday, on a Monday & Thursday schedule.  Dr. Maple Hudson told me that he will probably keep me on the 1:50 vials for awhile before he moves me to the 1:10 level.  If thst is accurate, how do I build-up on this 1:50 level.  Do I go twice weekly until I reach the .08 level, then reduce to .05 and stay there as maintenance until Dr. Maple Hudson moves me to the 1:10 level?  Please advise Dr Maple Hudson. Thanks.

## 2013-05-30 DIAGNOSIS — Z23 Encounter for immunization: Secondary | ICD-10-CM | POA: Diagnosis not present

## 2013-05-31 NOTE — Telephone Encounter (Signed)
This was given to Tammy in Allergy Lab and she will call Mr Gauthreaux

## 2013-06-01 ENCOUNTER — Telehealth: Payer: Self-pay | Admitting: Internal Medicine

## 2013-06-01 NOTE — Telephone Encounter (Signed)
I called Mr.Enslin answering his question about 1:50 build-up instructions. Pt. Understood nothing further was needed.

## 2013-07-08 ENCOUNTER — Other Ambulatory Visit: Payer: Self-pay

## 2013-07-13 ENCOUNTER — Encounter: Payer: Self-pay | Admitting: Internal Medicine

## 2013-07-13 NOTE — Telephone Encounter (Signed)
Pt is aware of CY recs. He will call back if he has questions.

## 2013-07-13 NOTE — Telephone Encounter (Signed)
I need your direction on thus matter....Marland KitchenMarland KitchenI will take my weekly injection maintenance doseagr of .05 today BUT will be unable to take another shot until November 20. After this 9-day period should I begin back at .05, or reduce the dose? If I do need to reduce then how much and then how quickly do I move back to .05? Sorry....Marland Kitchenbut thank you very much.  Chrissie Noa Braatz  Please advise. Carron Curie, CMA

## 2013-07-13 NOTE — Telephone Encounter (Signed)
When you restart, first dose will be 0.3 ml/ vial; next week 0.4 ml/ vial; thereafter resume 0.5 ml/ vial. Hold after any concerning reaction and call us before continuing shots.

## 2013-08-05 DIAGNOSIS — Z8546 Personal history of malignant neoplasm of prostate: Secondary | ICD-10-CM | POA: Diagnosis not present

## 2013-08-05 DIAGNOSIS — N4340 Spermatocele of epididymis, unspecified: Secondary | ICD-10-CM | POA: Diagnosis not present

## 2013-08-10 ENCOUNTER — Ambulatory Visit: Payer: PRIVATE HEALTH INSURANCE | Admitting: Internal Medicine

## 2013-08-19 ENCOUNTER — Other Ambulatory Visit: Payer: Self-pay | Admitting: Urology

## 2013-08-19 DIAGNOSIS — N4340 Spermatocele of epididymis, unspecified: Secondary | ICD-10-CM | POA: Diagnosis not present

## 2013-08-19 DIAGNOSIS — D4959 Neoplasm of unspecified behavior of other genitourinary organ: Secondary | ICD-10-CM | POA: Diagnosis not present

## 2013-08-24 ENCOUNTER — Encounter (HOSPITAL_BASED_OUTPATIENT_CLINIC_OR_DEPARTMENT_OTHER): Payer: Self-pay | Admitting: *Deleted

## 2013-08-24 DIAGNOSIS — Z0000 Encounter for general adult medical examination without abnormal findings: Secondary | ICD-10-CM | POA: Diagnosis not present

## 2013-08-24 DIAGNOSIS — I10 Essential (primary) hypertension: Secondary | ICD-10-CM | POA: Diagnosis not present

## 2013-08-24 DIAGNOSIS — E7800 Pure hypercholesterolemia, unspecified: Secondary | ICD-10-CM | POA: Diagnosis not present

## 2013-08-24 DIAGNOSIS — Z125 Encounter for screening for malignant neoplasm of prostate: Secondary | ICD-10-CM | POA: Diagnosis not present

## 2013-08-30 DIAGNOSIS — K219 Gastro-esophageal reflux disease without esophagitis: Secondary | ICD-10-CM | POA: Diagnosis not present

## 2013-08-30 DIAGNOSIS — Z7982 Long term (current) use of aspirin: Secondary | ICD-10-CM | POA: Diagnosis not present

## 2013-08-30 DIAGNOSIS — I10 Essential (primary) hypertension: Secondary | ICD-10-CM | POA: Diagnosis not present

## 2013-08-30 DIAGNOSIS — Z8619 Personal history of other infectious and parasitic diseases: Secondary | ICD-10-CM | POA: Diagnosis not present

## 2013-08-30 DIAGNOSIS — E7800 Pure hypercholesterolemia, unspecified: Secondary | ICD-10-CM | POA: Diagnosis not present

## 2013-08-31 ENCOUNTER — Encounter (HOSPITAL_BASED_OUTPATIENT_CLINIC_OR_DEPARTMENT_OTHER): Payer: Self-pay | Admitting: *Deleted

## 2013-08-31 NOTE — Progress Notes (Addendum)
Pt instructed npo p mn 1/4 x toprol, lipitor, nexium,zoloft w sip of water.  To [facility] 1/5 @ 0900. Needs cbc, istat on arrival.  Measure neck.  ekg requested from Dr. Carolee Rota office [phone].left message w lisa. Pt to bring cpap on day of surgery.

## 2013-09-01 ENCOUNTER — Encounter (HOSPITAL_BASED_OUTPATIENT_CLINIC_OR_DEPARTMENT_OTHER): Payer: Self-pay | Admitting: *Deleted

## 2013-09-06 ENCOUNTER — Ambulatory Visit (HOSPITAL_BASED_OUTPATIENT_CLINIC_OR_DEPARTMENT_OTHER)
Admission: RE | Admit: 2013-09-06 | Discharge: 2013-09-06 | Disposition: A | Discharge status: Home / Self Care | Payer: Medicare Other | Source: Ambulatory Visit | Attending: Urology | Admitting: Urology

## 2013-09-06 ENCOUNTER — Encounter (HOSPITAL_BASED_OUTPATIENT_CLINIC_OR_DEPARTMENT_OTHER): Payer: Medicare Other | Admitting: Anesthesiology

## 2013-09-06 ENCOUNTER — Encounter (HOSPITAL_BASED_OUTPATIENT_CLINIC_OR_DEPARTMENT_OTHER): Payer: Self-pay

## 2013-09-06 ENCOUNTER — Ambulatory Visit (HOSPITAL_BASED_OUTPATIENT_CLINIC_OR_DEPARTMENT_OTHER): Payer: Medicare Other | Admitting: Anesthesiology

## 2013-09-06 ENCOUNTER — Encounter (HOSPITAL_BASED_OUTPATIENT_CLINIC_OR_DEPARTMENT_OTHER)
Admission: RE | Disposition: A | Discharge status: Home / Self Care | Payer: Self-pay | Source: Ambulatory Visit | Attending: Urology

## 2013-09-06 DIAGNOSIS — I10 Essential (primary) hypertension: Secondary | ICD-10-CM | POA: Insufficient documentation

## 2013-09-06 DIAGNOSIS — N508 Other specified disorders of male genital organs: Secondary | ICD-10-CM | POA: Insufficient documentation

## 2013-09-06 DIAGNOSIS — Z8546 Personal history of malignant neoplasm of prostate: Secondary | ICD-10-CM | POA: Diagnosis not present

## 2013-09-06 DIAGNOSIS — E7800 Pure hypercholesterolemia, unspecified: Secondary | ICD-10-CM | POA: Insufficient documentation

## 2013-09-06 DIAGNOSIS — Z79899 Other long term (current) drug therapy: Secondary | ICD-10-CM | POA: Insufficient documentation

## 2013-09-06 DIAGNOSIS — Z7982 Long term (current) use of aspirin: Secondary | ICD-10-CM | POA: Insufficient documentation

## 2013-09-06 DIAGNOSIS — D2930 Benign neoplasm of unspecified epididymis: Secondary | ICD-10-CM | POA: Diagnosis not present

## 2013-09-06 DIAGNOSIS — N529 Male erectile dysfunction, unspecified: Secondary | ICD-10-CM | POA: Insufficient documentation

## 2013-09-06 DIAGNOSIS — G4730 Sleep apnea, unspecified: Secondary | ICD-10-CM | POA: Insufficient documentation

## 2013-09-06 DIAGNOSIS — N5089 Other specified disorders of the male genital organs: Secondary | ICD-10-CM | POA: Diagnosis not present

## 2013-09-06 DIAGNOSIS — N509 Disorder of male genital organs, unspecified: Secondary | ICD-10-CM | POA: Diagnosis not present

## 2013-09-06 DIAGNOSIS — K219 Gastro-esophageal reflux disease without esophagitis: Secondary | ICD-10-CM | POA: Insufficient documentation

## 2013-09-06 HISTORY — DX: Unspecified right bundle-branch block: I45.10

## 2013-09-06 HISTORY — DX: Other specified disorders of the male genital organs: N50.89

## 2013-09-06 HISTORY — DX: Anxiety disorder, unspecified: F41.9

## 2013-09-06 HISTORY — DX: Obstructive sleep apnea (adult) (pediatric): G47.33

## 2013-09-06 HISTORY — DX: Thrombocytopenia, unspecified: D69.6

## 2013-09-06 HISTORY — DX: Irritable bowel syndrome, unspecified: K58.9

## 2013-09-06 HISTORY — DX: Cough: R05

## 2013-09-06 HISTORY — DX: Unspecified osteoarthritis, unspecified site: M19.90

## 2013-09-06 HISTORY — PX: SCROTAL EXPLORATION: SHX2386

## 2013-09-06 HISTORY — DX: Cough, unspecified: R05.9

## 2013-09-06 HISTORY — DX: Dependence on other enabling machines and devices: Z99.89

## 2013-09-06 LAB — POCT I-STAT 4, (NA,K, GLUC, HGB,HCT)
Glucose, Bld: 99 mg/dL (ref 70–99)
HCT: 40 % (ref 39.0–52.0)
Hemoglobin: 13.6 g/dL (ref 13.0–17.0)
Potassium: 3.8 mEq/L (ref 3.7–5.3)
Sodium: 144 mEq/L (ref 137–147)

## 2013-09-06 SURGERY — EXPLORATION, SCROTUM
Anesthesia: General | Site: Scrotum | Laterality: Right

## 2013-09-06 MED ORDER — EPHEDRINE SULFATE 50 MG/ML IJ SOLN
INTRAMUSCULAR | Status: DC | PRN
  Administered 2013-09-06 (×2): 10 mg via INTRAVENOUS

## 2013-09-06 MED ORDER — SODIUM CHLORIDE 0.9 % IR SOLN
Status: DC | PRN
  Administered 2013-09-06: 500 mL

## 2013-09-06 MED ORDER — BUPIVACAINE-EPINEPHRINE 0.5% -1:200000 IJ SOLN
INTRAMUSCULAR | Status: DC | PRN
  Administered 2013-09-06: 10 mL

## 2013-09-06 MED ORDER — VANCOMYCIN HCL IN DEXTROSE 1-5 GM/200ML-% IV SOLN
1000.0000 mg | Freq: Once | INTRAVENOUS | Status: AC
  Administered 2013-09-06: 1000 mg via INTRAVENOUS
  Filled 2013-09-06: qty 200

## 2013-09-06 MED ORDER — LACTATED RINGERS IV SOLN
INTRAVENOUS | Status: DC
  Administered 2013-09-06: 10:00:00 via INTRAVENOUS
  Filled 2013-09-06: qty 1000

## 2013-09-06 MED ORDER — PROPOFOL 10 MG/ML IV BOLUS
INTRAVENOUS | Status: DC | PRN
  Administered 2013-09-06: 200 mg via INTRAVENOUS

## 2013-09-06 MED ORDER — ONDANSETRON HCL 4 MG/2ML IJ SOLN
INTRAMUSCULAR | Status: DC | PRN
  Administered 2013-09-06: 4 mg via INTRAVENOUS

## 2013-09-06 MED ORDER — MIDAZOLAM HCL 2 MG/2ML IJ SOLN
INTRAMUSCULAR | Status: AC
  Filled 2013-09-06: qty 4

## 2013-09-06 MED ORDER — GLYCOPYRROLATE 0.2 MG/ML IJ SOLN: INTRAMUSCULAR | Status: DC | PRN

## 2013-09-06 MED ORDER — FENTANYL CITRATE 0.05 MG/ML IJ SOLN
INTRAMUSCULAR | Status: AC
  Filled 2013-09-06: qty 4

## 2013-09-06 MED ORDER — OXYCODONE-ACETAMINOPHEN 10-325 MG PO TABS: 1.0000 | ORAL_TABLET | ORAL | Status: DC | PRN

## 2013-09-06 MED ORDER — KETOROLAC TROMETHAMINE 30 MG/ML IJ SOLN
INTRAMUSCULAR | Status: DC | PRN
Start: 2013-09-06 — End: 2013-09-06
  Administered 2013-09-06: 30 mg via INTRAVENOUS

## 2013-09-06 MED ORDER — DEXAMETHASONE SODIUM PHOSPHATE 4 MG/ML IJ SOLN
INTRAMUSCULAR | Status: DC | PRN
  Administered 2013-09-06: 10 mg via INTRAVENOUS

## 2013-09-06 MED ORDER — FENTANYL CITRATE 0.05 MG/ML IJ SOLN
INTRAMUSCULAR | Status: DC | PRN
  Administered 2013-09-06 (×7): 12.5 ug via INTRAVENOUS
  Administered 2013-09-06: 50 ug via INTRAVENOUS
  Administered 2013-09-06 (×5): 12.5 ug via INTRAVENOUS

## 2013-09-06 MED ORDER — MIDAZOLAM HCL 5 MG/5ML IJ SOLN
INTRAMUSCULAR | Status: DC | PRN
  Administered 2013-09-06 (×2): 0.5 mg via INTRAVENOUS
  Administered 2013-09-06: 2 mg via INTRAVENOUS
  Administered 2013-09-06: 1 mg via INTRAVENOUS

## 2013-09-06 MED ORDER — LACTATED RINGERS IV SOLN
INTRAVENOUS | Status: DC | PRN
  Administered 2013-09-06 (×2): via INTRAVENOUS

## 2013-09-06 MED ORDER — LIDOCAINE HCL (CARDIAC) 20 MG/ML IV SOLN
INTRAVENOUS | Status: DC | PRN
  Administered 2013-09-06: 100 mg via INTRAVENOUS

## 2013-09-06 SURGICAL SUPPLY — 49 items
APL SKNCLS STERI-STRIP NONHPOA (GAUZE/BANDAGES/DRESSINGS)
APPLICATOR COTTON TIP 6IN STRL (MISCELLANEOUS) IMPLANT
BANDAGE GAUZE ELAST BULKY 4 IN (GAUZE/BANDAGES/DRESSINGS) ×1 IMPLANT
BENZOIN TINCTURE PRP APPL 2/3 (GAUZE/BANDAGES/DRESSINGS) IMPLANT
BLADE SURG 15 STRL LF DISP TIS (BLADE) ×1 IMPLANT
BLADE SURG 15 STRL SS (BLADE) ×2
BLADE SURG ROTATE 9660 (MISCELLANEOUS) ×2 IMPLANT
BNDG GAUZE ELAST 4 BULKY (GAUZE/BANDAGES/DRESSINGS) ×2 IMPLANT
CLOTH BEACON ORANGE TIMEOUT ST (SAFETY) ×2 IMPLANT
COVER MAYO STAND STRL (DRAPES) ×2 IMPLANT
COVER TABLE BACK 60X90 (DRAPES) ×2 IMPLANT
DRAIN PENROSE 18X1/2 LTX STRL (DRAIN) IMPLANT
DRAPE PED LAPAROTOMY (DRAPES) ×2 IMPLANT
DRSG TEGADERM 4X4.75 (GAUZE/BANDAGES/DRESSINGS) IMPLANT
ELECT NDL BLADE 2-5/6 (NEEDLE) IMPLANT
ELECT NEEDLE BLADE 2-5/6 (NEEDLE) ×2 IMPLANT
ELECT REM PT RETURN 9FT ADLT (ELECTROSURGICAL) ×2
ELECTRODE REM PT RTRN 9FT ADLT (ELECTROSURGICAL) ×1 IMPLANT
GLOVE BIO SURGEON STRL SZ7.5 (GLOVE) ×1 IMPLANT
GLOVE BIO SURGEON STRL SZ8 (GLOVE) ×2 IMPLANT
GLOVE INDICATOR 7.5 STRL GRN (GLOVE) ×2 IMPLANT
GOWN PREVENTION PLUS LG XLONG (DISPOSABLE) ×2 IMPLANT
GOWN STRL REIN XL XLG (GOWN DISPOSABLE) ×2 IMPLANT
IV NS IRRIG 3000ML ARTHROMATIC (IV SOLUTION) IMPLANT
NEEDLE ELECTRODE (NEEDLE) IMPLANT
NEEDLE HYPO 22GX1.5 SAFETY (NEEDLE) IMPLANT
NS IRRIG 500ML POUR BTL (IV SOLUTION) ×1 IMPLANT
PACK BASIN DAY SURGERY FS (CUSTOM PROCEDURE TRAY) ×2 IMPLANT
PENCIL BUTTON HOLSTER BLD 10FT (ELECTRODE) ×2 IMPLANT
SPONGE INTESTINAL PEANUT (DISPOSABLE) IMPLANT
STRIP CLOSURE SKIN 1/2X4 (GAUZE/BANDAGES/DRESSINGS) IMPLANT
STRIP CLOSURE SKIN 1/4X4 (GAUZE/BANDAGES/DRESSINGS) IMPLANT
SUPPORT SCROTAL LG STRP (MISCELLANEOUS) IMPLANT
SUPPORT SCROTAL MED ADLT STRP (MISCELLANEOUS) ×1 IMPLANT
SUT CHROMIC 3 0 SH 27 (SUTURE) ×4 IMPLANT
SUT MNCRL AB 4-0 PS2 18 (SUTURE) IMPLANT
SUT SILK 2 0 SH (SUTURE) IMPLANT
SUT SILK 2 0 TIES 17X18 (SUTURE)
SUT SILK 2-0 18XBRD TIE BLK (SUTURE) IMPLANT
SUT SILK 3 0 SH 30 (SUTURE) ×2 IMPLANT
SUT VIC AB 0 SH 27 (SUTURE) IMPLANT
SUT VIC AB 2-0 CT2 27 (SUTURE) IMPLANT
SUT VIC AB 3-0 SH 27 (SUTURE)
SUT VIC AB 3-0 SH 27X BRD (SUTURE) IMPLANT
SYR CONTROL 10ML LL (SYRINGE) IMPLANT
TOWEL OR 17X24 6PK STRL BLUE (TOWEL DISPOSABLE) ×4 IMPLANT
TRAY DSU PREP LF (CUSTOM PROCEDURE TRAY) ×2 IMPLANT
WATER STERILE IRR 3000ML UROMA (IV SOLUTION) IMPLANT
WATER STERILE IRR 500ML POUR (IV SOLUTION) ×2 IMPLANT

## 2013-09-06 NOTE — Discharge Instructions (Addendum)
Post Anesthesia Home Care Instructions  Activity: Get plenty of rest for the remainder of the day. A responsible adult should stay with you for 24 hours following the procedure.  For the next 24 hours, DO NOT: -Drive a car -Paediatric nurse -Drink alcoholic beverages -Take any medication unless instructed by your physician -Make any legal decisions or sign important papers.  Meals: Start with liquid foods such as gelatin or soup. Progress to regular foods as tolerated. Avoid greasy, spicy, heavy foods. If nausea and/or vomiting occur, drink only clear liquids until the nausea and/or vomiting subsides. Call your physician if vomiting continues.  Special Instructions/Symptoms: Your throat may feel dry or sore from the anesthesia or the breathing tube placed in your throat during surgery. If this causes discomfort, gargle with warm salt water. The discomfort should disappear within 24 hours.  Post Anesthesia Home Care Instructions  Activity: Get plenty of rest for the remainder of the day. A responsible adult should stay with you for 24 hours following the procedure.  For the next 24 hours, DO NOT: -Drive a car -Paediatric nurse -Drink alcoholic beverages -Take any medication unless instructed by your physician -Make any legal decisions or sign important papers.  Meals: Start with liquid foods such as gelatin or soup. Progress to regular foods as tolerated. Avoid greasy, spicy, heavy foods. If nausea and/or vomiting occur, drink only clear liquids until the nausea and/or vomiting subsides. Call your physician if vomiting continues.  Special Instructions/Symptoms: Your throat may feel dry or sore from the anesthesia or the breathing tube placed in your throat during surgery. If this causes discomfort, gargle with warm salt water. The discomfort should disappear within 24 hours.  Post Anesthesia Home Care Instructions  Activity: Get plenty of rest for the remainder of the day. A  responsible adult should stay with you for 24 hours following the procedure.  For the next 24 hours, DO NOT: -Drive a car -Paediatric nurse -Drink alcoholic beverages -Take any medication unless instructed by your physician -Make any legal decisions or sign important papers.  Meals: Start with liquid foods such as gelatin or soup. Progress to regular foods as tolerated. Avoid greasy, spicy, heavy foods. If nausea and/or vomiting occur, drink only clear liquids until the nausea and/or vomiting subsides. Call your physician if vomiting continues.  Special Instructions/Symptoms: Your throat may feel dry or sore from the anesthesia or the breathing tube placed in your throat during surgery. If this causes discomfort, gargle with warm salt water. The discomfort should disappear within 24 hours. HOME CARE INSTRUCTIONS FOR SCROTAL PROCEDURES  Wound Care & Hygiene: You may apply an ice bag to the scrotum for the first 24 hours.  This may help decrease swelling and soreness.  You may have a dressing held in place by an athletic supporter.  You may remove the dressing in 24 hours and shower in 48 hours.  Continue to use the athletic supporter or tight briefs for at least a week.  Activity: Rest today - not necessarily flat bed rest.  Just take it easy.  You should not do strenuous activities until your follow-up visit with your doctor.  You may resume light activity in 48 hours.  Return to Work:  Your doctor will advise you of this depending on the type of work you do  Diet: Drink liquids or eat a light diet this evening.  You may resume a regular diet tomorrow.  General Expectations: You may have a small amount of bleeding.  The  scrotum may be swollen or bruised for about a week.  Call your Doctor if these occur:  -persistent or heavy bleeding  -temperature of 101 degrees or more  -severe pain, not relieved by your pain medication  Return to [place]:  *** Call to set up and  appointment.  Patient Signature:  __________________________________________________  Nurse's Signature:  __________________________________________________

## 2013-09-06 NOTE — Anesthesia Procedure Notes (Signed)
Procedure Name: LMA Insertion Date/Time: 09/06/2013 10:36 AM Performed by: Justice Rocher Pre-anesthesia Checklist: Patient identified, Emergency Drugs available, Suction available and Patient being monitored Patient Re-evaluated:Patient Re-evaluated prior to inductionOxygen Delivery Method: Circle System Utilized Preoxygenation: Pre-oxygenation with 100% oxygen Intubation Type: IV induction Ventilation: Mask ventilation without difficulty LMA: LMA inserted LMA Size: 4.0 Number of attempts: 1 Airway Equipment and Method: bite block Placement Confirmation: positive ETCO2 Tube secured with: Tape Dental Injury: Teeth and Oropharynx as per pre-operative assessment

## 2013-09-06 NOTE — H&P (Signed)
[name] is a 68 year old male with a history of prostate cancer who was seen for further evaluation of a right hemiscrotal swelling.    History of Present Illness      History of adenocarcinoma of the prostate:    He had a radical prostatectomy in 09/00. It was a pathologic stage T2b, negative margins, no capsular penetration. It was Gleason 6.    His PSA was 3.6, and his PSA since that time, has been undetectable.     He initially had a lot of rectal spasms, but those have calmed down over the years, and he now has a little erectile dysfunction, and he also has bilateral epididymal cysts that he wants me to keep an eye on as well.    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    Interval history: No new urologic complaints are noted today. The area in his right hemiscrotum has not changed.   Past Medical History Problems  1. History of Anxiety (Symptom) (799.2) 2. History of esophageal reflux (V12.79) 3. History of hypercholesterolemia (V12.29) 4. History of hypertension (V12.59) 5. History of prostate cancer (V10.46) 6. History of sleep apnea (V13.89)  Surgical History Problems  1. History of Biopsy Of The Prostate Needle 2. History of Prostatect Retropubic Radical W/ Bilat Pelv Lymphadenectomy  Current Meds 1. Aspirin 81 MG Oral Tablet;  Therapy: (Recorded:13Dec2007) to Recorded 2. Benzonatate 100 MG Oral Capsule;  Therapy: [id number] to Recorded 3. EpiPen 2-Pak 0.3 MG/0.3ML DEVI;  Therapy: [id number] to Recorded 4. Fish Oil CAPS;  Therapy: (Recorded:13Dec2007) to Recorded 5. Fluticasone Propionate 50 MCG/ACT Nasal Suspension;  Therapy: [id number] to Recorded 6. Hyoscyamine Sulfate 0.125 MG Sublingual Tablet Sublingual; PLACE 1 TABLET UNDER  THE TONGUE EVERY 3 TO 4 HOURS AS NEEDED FOR BLADDER SPASMS;  Therapy: 30Apr2014 to (Evaluate:29Jun2014)  Requested for: 30Apr2014; Last  Rx:30Apr2014 Ordered 7. Levitra 20 MG Oral Tablet; TAKE  1 TABLET As Directed;  Therapy: [id number] to (Last Rx:04Dec2013) Ordered 8. Lipitor 10 MG Oral Tablet;  Therapy: (Recorded:13Dec2007) to Recorded 9. Multivitamin/Iron TABS;  Therapy: (Recorded:13Dec2007) to Recorded 10. NexIUM 40 MG Oral Capsule Delayed Release;   Therapy: (Recorded:13Dec2007) to Recorded 11. Omeprazole 40 MG Oral Capsule Delayed Release;   Therapy: 23Feb2012 to Recorded 12. PEG 3350-KCl-Na Bicarb-NaCl 420 GM Oral Solution Reconstituted;   Therapy: [id number] to Recorded 13. Sertraline HCl - 100 MG Oral Tablet;   Therapy: 22Oct2011 to Recorded 14. Toprol XL 50 MG Oral Tablet Extended Release 24 Hour;   Therapy: (Recorded:13Dec2007) to Recorded 15. Transderm-Scop 1.5 MG Transdermal Patch 72 Hour;   Therapy: [id number] to Recorded 16. Viagra 100 MG Oral Tablet;   Therapy: (Recorded:13Dec2007) to Recorded  Allergies Medication  1. Darvocet-N 100 TABS 2. Doxy-Caps CAPS 3. Erythromycin Base TBEC 4. Penicillins 5. Macrodantin CAPS 6. Septra DS TABS  Family History Problems  1. Family history of Family Health Status Number Of Children   2 sons, 1 daughter 2. Family history of Hypercholesterolemia : Mother 3. Family history of Hypertension : Father 34. Family history of Hypertension : Mother  Social History Problems  1. Alcohol Use   2/day 2. Denied: Caffeine Use 3. Former smoker (V15.82)   quit smoking in 1970 4. Marital History - Currently Married 5. Occupation:   [employer] 6. Tobacco Use (V15.82)   Smoked for 5 yrs., quit in July 1970  Review of Systems Genitourinary and gastrointestinal system(s) were reviewed and pertinent findings if present are noted.  Genitourinary: erectile dysfunction.  Constitutional:  no recent weight loss.  Musculoskeletal: no bone pain.  The remainder of his 13 point ROS was negative.  Vitals Vital Signs Height: 6 ft 2 in Weight: 175 lb  BMI Calculated: 22.47 BSA Calculated: 2.05 Blood Pressure: 133 /  70 Temperature: 98.4 F Heart Rate: 62  PE General- Alert, Oriented, Affect-appropriate, Distress- none acute, tall, thin Skin- +seborrheic keratoses on back Lymphadenopathy- none Head- atraumatic            Eyes- Gross vision intact, PERRLA, conjunctivae clear secretions            Ears- Hearing, canals-normal            Nose- clear, no-Septal dev, mucus, polyps, erosion, perforation              Throat- Mallampati II , mucosa clear , drainage- none, tonsils- atrophic Neck- flexible , trachea midline, no stridor , thyroid nl, carotid no bruit Chest - symmetrical excursion , unlabored           Heart/CV- RRR , no murmur , no gallop  , no rub, nl s1 s2                           - JVD- none , edema- none, stasis changes- none, varices- none           Lung- clear to P&A, wheeze- none, cough- none , dullness-none, rub- none           Chest wall-   Abd-   Br/ Gen/ Rectal- Not done, not indicated Extrem- cyanosis- none, clubbing, none, atrophy- none, strength- nl Neuro- grossly intact to observation

## 2013-09-06 NOTE — Anesthesia Postprocedure Evaluation (Signed)
  Anesthesia Post-op Note  Patient: [name]  Procedure(s) Performed: Procedure(s) (LRB): SCROTAL EXPLORATION EXCISION RIGHT EPIDIDYMAL MASS  (Right)  Patient Location: PACU  Anesthesia Type: General  Level of Consciousness: awake and alert   Airway and Oxygen Therapy: Patient Spontanous Breathing  Post-op Pain: mild  Post-op Assessment: Post-op Vital signs reviewed, Patient's Cardiovascular Status Stable, Respiratory Function Stable, Patent Airway and No signs of Nausea or vomiting  Last Vitals:  Filed Vitals:   09/06/13 1145  BP:   Pulse:   Temp: 36.7 C  Resp:     Post-op Vital Signs: stable   Complications: No apparent anesthesia complications

## 2013-09-06 NOTE — Op Note (Signed)
PATIENT:  [name]  PRE-OPERATIVE DIAGNOSIS: Right scrotal mass  POST-OPERATIVE DIAGNOSIS:  Right epididymal mass  PROCEDURE:  Procedure(s): Excision of right epididymal mass  SURGEON:  Claybon Jabs  INDICATION: [name] is a 68 year old male who had a radical prostatectomy in 9/00. He had a known history of bilateral spermatoceles that had been monitored over the years and recently noted a new mass in the lower aspect of his right hemiscrotum. This was noted on physical examination and evaluated with ultrasound which revealed it to be separate from the testicle and associated with the epididymis at the tail. We discussed treatment options and he has elected for surgical exploration and removal of the mass.  ANESTHESIA:  General  EBL:  Minimal  DRAINS: None  LOCAL MEDICATIONS USED:  0.5% Marcaine with epinephrine  SPECIMEN:  Right epididymal mass  DISPOSITION OF SPECIMEN:  Pathology  Description of procedure: After informed consent the patient was brought to the major OR, placed on the table and administered general anesthesia. His genitalia was then sterilely prepped and draped. An official timeout was then performed.  A midline median raphae scrotal incision was then made and carried down over the right testicle. I opened the parietal tunica and delivered the testicle and epididymis. I was able to palpate the area in the tail of the epididymis. I made an incision on each side of this area through the visceral tunica vaginalis and then used a combination of sharp and blunt technique to isolate the mass. As I was isolating this and noted areas that appeared somewhat yellowish in color. One of these was entered and I noted a yellowish and fluid was present indicating that this was most likely a sperm granuloma of some form. It did not appear worrisome in any way. I was able to dissected away from the vessels. I used 3-0 silk to tie off the epididymis proximally and then fulgurated  this. I also ligated the vas at the level of the convoluted vas with 3-0 silk. This isolated the lesion and it was then excised completely. Bleeding points were point cauterized.   I then closed the visceral tunica vaginalis over the area where the lesion was excised using 4-0 chromic suture in a locking fashion. I replaced the testicle in the parietal tunica vaginalis and closed this with running 4-0 chromic suture. I then replaced the testicle in its normal anatomic position in the right hemiscrotum and injected Marcaine in the subcutaneous tissue. I then closed the scrotal skin with running 3-0 chromic suture. Neosporin was applied to the incision as well as sterile gauze, a fluffed Curlex and then a scrotal support was applied. The patient was then awakened and taken to the recovery room in stable and satisfactory condition. He tolerated this procedure well with no intraoperative complications. Needle, sponge and instrument counts were correct at the end of the operation.   His testicle was then replaced in the normal anatomic position and his right/left hemiscrotum. I then closed the deep scrotal tissue with running 3-0 chromic suture in a locking fashion. I injected quarter percent Marcaine in the subcutaneous tissue and closed the skin with running 3-0 chromic. Neosporin, a sterile gauze dressing, fluff Kerlix and a scrotal support were applied. The patient tolerated the procedure well no intraoperative complications. Needle sponge and instrument counts were correct at the end of the operation.   PLAN OF CARE: Discharge to home after PACU  PATIENT DISPOSITION:  PACU - hemodynamically stable.

## 2013-09-06 NOTE — Transfer of Care (Signed)
Immediate Anesthesia Transfer of Care Note  Patient: [name]  Procedure(s) Performed: Procedure(s) (LRB): SCROTAL EXPLORATION EXCISION RIGHT EPIDIDYMAL MASS  (Right)  Patient Location: PACU  Anesthesia Type: General  Level of Consciousness: awake, sedated, patient cooperative and responds to stimulation  Airway & Oxygen Therapy: Patient Spontanous Breathing and Patient connected to face mask oxygen  Post-op Assessment: Report given to PACU RN, Post -op Vital signs reviewed and stable and Patient moving all extremities  Post vital signs: Reviewed and stable  Complications: No apparent anesthesia complications

## 2013-09-06 NOTE — Anesthesia Preprocedure Evaluation (Addendum)
Anesthesia Evaluation  Patient identified by MRN, date of birth, ID band Patient awake    Reviewed: Allergy & Precautions, H&P , NPO status , Patient's Chart, lab work & pertinent test results, reviewed documented beta blocker date and time   Airway Mallampati: III TM Distance: >3 FB Neck ROM: full    Dental no notable dental hx. (+) Teeth Intact and Dental Advisory Given   Pulmonary sleep apnea and Continuous Positive Airway Pressure Ventilation , former smoker,  breath sounds clear to auscultation  Pulmonary exam normal       Cardiovascular Exercise Tolerance: Good negative cardio ROS  Rhythm:regular Rate:Normal     Neuro/Psych Anxiety Panic attacksnegative neurological ROS  negative psych ROS   GI/Hepatic negative GI ROS, Neg liver ROS, GERD-  Medicated and Controlled,  Endo/Other  negative endocrine ROS  Renal/GU negative Renal ROS  negative genitourinary   Musculoskeletal   Abdominal   Peds  Hematology negative hematology ROS (+) thrombocytopenia   Anesthesia Other Findings   Reproductive/Obstetrics negative OB ROS                          Anesthesia Physical Anesthesia Plan  ASA: III  Anesthesia Plan: General   Post-op Pain Management:    Induction: Intravenous  Airway Management Planned: LMA  Additional Equipment:   Intra-op Plan:   Post-operative Plan:   Informed Consent: I have reviewed the patients History and Physical, chart, labs and discussed the procedure including the risks, benefits and alternatives for the proposed anesthesia with the patient or authorized representative who has indicated his/her understanding and acceptance.   Dental Advisory Given  Plan Discussed with: CRNA and Surgeon  Anesthesia Plan Comments:         Anesthesia Quick Evaluation

## 2013-09-07 ENCOUNTER — Encounter (HOSPITAL_BASED_OUTPATIENT_CLINIC_OR_DEPARTMENT_OTHER): Payer: Self-pay | Admitting: Urology

## 2013-09-13 DIAGNOSIS — N508 Other specified disorders of male genital organs: Secondary | ICD-10-CM | POA: Diagnosis not present

## 2013-09-22 ENCOUNTER — Ambulatory Visit (INDEPENDENT_AMBULATORY_CARE_PROVIDER_SITE_OTHER): Payer: Medicare Other

## 2013-09-22 DIAGNOSIS — J309 Allergic rhinitis, unspecified: Secondary | ICD-10-CM

## 2013-10-13 ENCOUNTER — Ambulatory Visit (INDEPENDENT_AMBULATORY_CARE_PROVIDER_SITE_OTHER): Payer: Medicare Other | Admitting: Internal Medicine

## 2013-10-13 ENCOUNTER — Encounter: Payer: Self-pay | Admitting: Internal Medicine

## 2013-10-13 VITALS — BP 114/60 | HR 56 | Ht 74.0 in | Wt 184.0 lb

## 2013-10-13 DIAGNOSIS — G4733 Obstructive sleep apnea (adult) (pediatric): Secondary | ICD-10-CM | POA: Diagnosis not present

## 2013-10-13 DIAGNOSIS — J301 Allergic rhinitis due to pollen: Secondary | ICD-10-CM

## 2013-10-13 NOTE — Assessment & Plan Note (Signed)
Good compliance and control on 5 cwp- managed by Dr Shelia Media

## 2013-10-13 NOTE — Assessment & Plan Note (Signed)
Doing well.  Plan- increase to 1:10 next order

## 2013-10-13 NOTE — Progress Notes (Signed)
08/08/11- 65 yoM former smoker followed for allergic rhinitis/conjunctivitis, obstructive sleep apnea LOV-08/09/2010 Has had flu vaccine. Continues to do well with allergy shots. Uses Flonase when needed. Continues CPAP at [facility]. He uses it all night every night with no problems. His machine is about 68 years old and we discussed process of replacement when needed.  08/10/12- 56 yoM former smoker followed for allergic rhinitis/conjunctivitis, obstructive sleep apnea FOLLOWS FOR: wears CPAP 5/[facility] with nasal pillows mask every night for about 6 hours and no troubles with pressure; sleeps well. still on allergy vaccine 1:10 GO and doing well. Rare rhinitis flare. Flonase usually works. Occasionally needs Benadryl.  02/03/13- 65 yoM former smoker followed for allergic rhinitis/conjunctivitis, obstructive sleep apnea CPAP 5/ [facility] Dr Shelia Media PCP Comes today to update allergy skin test Using Flonase but no routine antihistamine. Has had more rhinitis symptoms this spring. Compliance with CPAP [place] Patient remains good. No antihistamines, OTC sleep aids, or OTC cough syrups in past 3 days. Allergy Skin Test 02/03/13-significant for weed and tree pollens, dust mite, several molds and grass pollen. We compared this to his previous vaccine with discussion. He wishes to remix and restart.  04/12/13- 67 yoM former smoker followed for allergic rhinitis/conjunctivitis, obstructive sleep apnea CPAP 5/ [facility] Dr Shelia Media PCP pt reports allergies doing well on vaccine--denies any other concerns at this time Continues building vaccine, now at 1:5000, giving own without problems. We reviewed risks benefits, EpiPen and protocols again. pt reports wearing CPAP 5/ [facility] every night x 6 hrs per night-- tolerating pressures fine, managed by Dr Shelia Media  10/13/13- 67 yoM former smoker followed for allergic rhinitis/conjunctivitis,  obstructive sleep apnea CPAP 5/ [facility] Dr Shelia Media PCP FOLLOWS FOR:  Wearing CPAP 5/ [facility] 6 hours per night,no complaints and does not need new supplies. Allergy Vaccine 1:50  GO doing well. No acute changes and no complaints. He is satisfied to continue. We discussed increasing allergy vaccine up to final maintenance level at 1:10. We discussed pneumococcal vaccine. History of large local reaction previously so I recommended against Prevnar.  ROS-see HPI Constitutional:   No-   weight loss, night sweats, fevers, chills, fatigue, lassitude. HEENT:   No-  headaches, difficulty swallowing, tooth/dental problems, sore throat,       No- current sneezing, itching, ear ache, nasal congestion, post nasal drip,  CV:  No-   chest pain, orthopnea, PND, swelling in lower extremities, anasarca, dizziness, palpitations Resp: No-   shortness of breath with exertion or at rest.              No-   productive cough,  No non-productive cough,  No- coughing up of blood.              No-   change in color of mucus.  No- wheezing.   Skin: No-   rash or lesions. GI:  No-   heartburn, indigestion, abdominal pain, nausea, vomiting,  GU:  MS:  No-   joint pain or swelling.   Neuro-     nothing unusual Psych:  No- change in mood or affect. No depression or anxiety.  No memory loss.  OBJ General- Alert, Oriented, Affect-appropriate, Distress- none acute, tall, thin Skin- +seborrheic keratoses on back Lymphadenopathy- none Head- atraumatic            Eyes- Gross vision intact, PERRLA, conjunctivae clear secretions  Ears- Hearing, canals-normal            Nose- narrow, no-Septal dev, mucus+ bridging, no-polyps, erosion, perforation             Throat- Mallampati II , mucosa clear , drainage- none, tonsils- atrophic Neck- flexible , trachea midline, no stridor , thyroid nl, carotid no bruit Chest - symmetrical excursion , unlabored           Heart/CV- RRR , no murmur , no  gallop  , no rub, nl s1 s2                           - JVD- none , edema- none, stasis changes- none, varices- none           Lung- clear to P&A, wheeze- none, cough- none , dullness-none, rub- none           Chest wall-  Abd-  Br/ Gen/ Rectal- Not done, not indicated Extrem- cyanosis- none, clubbing, none, atrophy- none, strength- nl Neuro- grossly intact to observation

## 2013-10-13 NOTE — Patient Instructions (Signed)
We will advance your allergy vaccine to 1:10 with the next order. The allergy lab can answer questions about doing this.

## 2013-11-03 DIAGNOSIS — B070 Plantar wart: Secondary | ICD-10-CM | POA: Diagnosis not present

## 2013-12-02 DIAGNOSIS — D696 Thrombocytopenia, unspecified: Secondary | ICD-10-CM | POA: Diagnosis not present

## 2014-01-26 ENCOUNTER — Encounter: Payer: Self-pay | Admitting: Internal Medicine

## 2014-01-26 MED ORDER — CEPHALEXIN 500 MG PO CAPS: 500.0000 mg | ORAL_CAPSULE | Freq: Four times a day (QID) | ORAL | Status: DC

## 2014-01-26 NOTE — Telephone Encounter (Signed)
Ok to send in prescription per Joellen Jersey.

## 2014-02-01 ENCOUNTER — Ambulatory Visit (INDEPENDENT_AMBULATORY_CARE_PROVIDER_SITE_OTHER): Payer: Medicare Other

## 2014-02-01 DIAGNOSIS — J309 Allergic rhinitis, unspecified: Secondary | ICD-10-CM

## 2014-03-15 ENCOUNTER — Other Ambulatory Visit: Payer: Self-pay | Admitting: Internal Medicine

## 2014-06-02 DIAGNOSIS — M2141 Flat foot [pes planus] (acquired), right foot: Secondary | ICD-10-CM | POA: Diagnosis not present

## 2014-06-02 DIAGNOSIS — L859 Epidermal thickening, unspecified: Secondary | ICD-10-CM | POA: Diagnosis not present

## 2014-06-02 DIAGNOSIS — M7742 Metatarsalgia, left foot: Secondary | ICD-10-CM | POA: Diagnosis not present

## 2014-06-02 DIAGNOSIS — M2040 Other hammer toe(s) (acquired), unspecified foot: Secondary | ICD-10-CM | POA: Diagnosis not present

## 2014-06-02 DIAGNOSIS — M7741 Metatarsalgia, right foot: Secondary | ICD-10-CM | POA: Diagnosis not present

## 2014-06-17 ENCOUNTER — Other Ambulatory Visit: Payer: Self-pay

## 2014-06-17 DIAGNOSIS — H43812 Vitreous degeneration, left eye: Secondary | ICD-10-CM | POA: Diagnosis not present

## 2014-06-17 DIAGNOSIS — D3132 Benign neoplasm of left choroid: Secondary | ICD-10-CM | POA: Diagnosis not present

## 2014-06-21 DIAGNOSIS — Z23 Encounter for immunization: Secondary | ICD-10-CM | POA: Diagnosis not present

## 2014-06-22 ENCOUNTER — Encounter: Payer: Self-pay | Admitting: Internal Medicine

## 2014-06-22 ENCOUNTER — Ambulatory Visit (INDEPENDENT_AMBULATORY_CARE_PROVIDER_SITE_OTHER): Payer: Medicare Other

## 2014-06-22 DIAGNOSIS — J309 Allergic rhinitis, unspecified: Secondary | ICD-10-CM

## 2014-07-04 DIAGNOSIS — Z8659 Personal history of other mental and behavioral disorders: Secondary | ICD-10-CM | POA: Diagnosis not present

## 2014-07-04 DIAGNOSIS — K219 Gastro-esophageal reflux disease without esophagitis: Secondary | ICD-10-CM | POA: Diagnosis not present

## 2014-07-11 ENCOUNTER — Encounter: Payer: Self-pay | Admitting: Internal Medicine

## 2014-09-06 DIAGNOSIS — Z8546 Personal history of malignant neoplasm of prostate: Secondary | ICD-10-CM | POA: Diagnosis not present

## 2014-09-08 DIAGNOSIS — E780 Pure hypercholesterolemia: Secondary | ICD-10-CM | POA: Diagnosis not present

## 2014-09-08 DIAGNOSIS — Z125 Encounter for screening for malignant neoplasm of prostate: Secondary | ICD-10-CM | POA: Diagnosis not present

## 2014-09-08 DIAGNOSIS — Z7982 Long term (current) use of aspirin: Secondary | ICD-10-CM | POA: Diagnosis not present

## 2014-09-08 DIAGNOSIS — I10 Essential (primary) hypertension: Secondary | ICD-10-CM | POA: Diagnosis not present

## 2014-09-08 DIAGNOSIS — Z0000 Encounter for general adult medical examination without abnormal findings: Secondary | ICD-10-CM | POA: Diagnosis not present

## 2014-09-14 DIAGNOSIS — F411 Generalized anxiety disorder: Secondary | ICD-10-CM | POA: Diagnosis not present

## 2014-09-14 DIAGNOSIS — I10 Essential (primary) hypertension: Secondary | ICD-10-CM | POA: Diagnosis not present

## 2014-09-14 DIAGNOSIS — I4510 Unspecified right bundle-branch block: Secondary | ICD-10-CM | POA: Diagnosis not present

## 2014-10-06 ENCOUNTER — Ambulatory Visit (INDEPENDENT_AMBULATORY_CARE_PROVIDER_SITE_OTHER): Payer: Medicare Other | Admitting: Internal Medicine

## 2014-10-06 ENCOUNTER — Encounter: Payer: Self-pay | Admitting: Internal Medicine

## 2014-10-06 VITALS — BP 122/70 | HR 60 | Ht 74.0 in | Wt 188.0 lb

## 2014-10-06 DIAGNOSIS — J301 Allergic rhinitis due to pollen: Secondary | ICD-10-CM | POA: Diagnosis not present

## 2014-10-06 MED ORDER — EPINEPHRINE 0.3 MG/0.3ML IJ SOAJ
INTRAMUSCULAR | Status: DC
Start: 2014-10-06 — End: 2015-10-09

## 2014-10-06 MED ORDER — AZELASTINE-FLUTICASONE 137-50 MCG/ACT NA SUSP: 1.0000 | Freq: Every day | NASAL | Status: AC

## 2014-10-06 MED ORDER — FLUTICASONE PROPIONATE 50 MCG/ACT NA SUSP: 2.0000 | Freq: Every day | NASAL | Status: DC

## 2014-10-06 MED ORDER — "TUBERCULIN-ALLERGY SYRINGES 28G X 1/2"" 1 ML MISC": Status: DC

## 2014-10-06 MED ORDER — CEPHALEXIN 500 MG PO CAPS: 500.0000 mg | ORAL_CAPSULE | Freq: Four times a day (QID) | ORAL | Status: DC

## 2014-10-06 NOTE — Patient Instructions (Signed)
We can continue allergy vaccine 1:10 GO  Refills sent for meds as discussed  Please call as needed

## 2014-10-06 NOTE — Progress Notes (Signed)
08/08/11- 65 yoM former smoker followed for allergic rhinitis/conjunctivitis, obstructive sleep apnea LOV-08/09/2010 Has had flu vaccine. Continues to do well with allergy shots. Uses Flonase when needed. Continues CPAP at [facility]. He uses it all night every night with no problems. His machine is about 69 years old and we discussed process of replacement when needed.  08/10/12- 40 yoM former smoker followed for allergic rhinitis/conjunctivitis, obstructive sleep apnea FOLLOWS FOR: wears CPAP 5/[facility] with nasal pillows mask every night for about 6 hours and no troubles with pressure; sleeps well. still on allergy vaccine 1:10 GO and doing well. Rare rhinitis flare. Flonase usually works. Occasionally needs Benadryl.  02/03/13- 65 yoM former smoker followed for allergic rhinitis/conjunctivitis, obstructive sleep apnea CPAP 5/ [facility] Dr Shelia Media PCP Comes today to update allergy skin test Using Flonase but no routine antihistamine. Has had more rhinitis symptoms this spring. Compliance with CPAP [place] Patient remains good. No antihistamines, OTC sleep aids, or OTC cough syrups in past 3 days. Allergy Skin Test 02/03/13-significant for weed and tree pollens, dust mite, several molds and grass pollen. We compared this to his previous vaccine with discussion. He wishes to remix and restart.  04/12/13- 67 yoM former smoker followed for allergic rhinitis/conjunctivitis, obstructive sleep apnea CPAP 5/ [facility] Dr Shelia Media PCP pt reports allergies doing well on vaccine--denies any other concerns at this time Continues building vaccine, now at 1:5000, giving own without problems. We reviewed risks benefits, EpiPen and protocols again. pt reports wearing CPAP 5/ [facility] every night x 6 hrs per night-- tolerating pressures fine, managed by Dr Shelia Media  10/13/13- 67 yoM former smoker followed for allergic rhinitis/conjunctivitis,  obstructive sleep apnea CPAP 5/ [facility] Dr Shelia Media PCP FOLLOWS FOR:  Wearing CPAP 5/ [facility] 6 hours per night,no complaints and does not need new supplies. Allergy Vaccine 1:50  GO doing well. No acute changes and no complaints. He is satisfied to continue. We discussed increasing allergy vaccine up to final maintenance level at 1:10. We discussed pneumococcal vaccine. History of large local reaction previously so I recommended against Prevnar.  10/06/14- 68 yoM former smoker followed for allergic rhinitis/conjunctivitis, obstructive sleep apnea FOLLOWS FOR: Wears CPAP 5/ [facility] every night-Dr Pharr. Continues allergy vaccine and no compliants Allergy vaccine 1:10 GO. History of local reaction but no recent problems. Feels well controlled. Using Dymista nasal spray  ROS-see HPI Constitutional:   No-   weight loss, night sweats, fevers, chills, fatigue, lassitude. HEENT:   No-  headaches, difficulty swallowing, tooth/dental problems, sore throat,       No- current sneezing, itching, ear ache, nasal congestion, post nasal drip,  CV:  No-   chest pain, orthopnea, PND, swelling in lower extremities, anasarca, dizziness, palpitations Resp: No-   shortness of breath with exertion or at rest.              No-   productive cough,  No non-productive cough,  No- coughing up of blood.              No-   change in color of mucus.  No- wheezing.   Skin: No-   rash or lesions. GI:  No-   heartburn, indigestion, abdominal pain, nausea, vomiting,  GU:  MS:  No-   joint pain or swelling.   Neuro-     nothing unusual Psych:  No- change in mood or affect. No depression or anxiety.  No memory loss.  OBJ General- Alert, Oriented, Affect-appropriate, Distress- none acute, tall, thin Skin- +seborrheic keratoses on back Lymphadenopathy- none Head- atraumatic            Eyes- Gross vision intact, PERRLA, conjunctivae clear secretions            Ears- Hearing, canals-normal             Nose- narrow, no-Septal dev, mucus+ bridging, no-polyps, erosion, perforation             Throat- Mallampati II , mucosa clear , drainage- none, tonsils- atrophic Neck- flexible , trachea midline, no stridor , thyroid nl, carotid no bruit Chest - symmetrical excursion , unlabored           Heart/CV- RRR , no murmur , no gallop  , no rub, nl s1 s2                           - JVD- none , edema- none, stasis changes- none, varices- none           Lung- clear to P&A, wheeze- none, cough- none , dullness-none, rub- none           Chest wall-  Abd-  Br/ Gen/ Rectal- Not done, not indicated Extrem- cyanosis- none, clubbing, none, atrophy- none, strength- nl Neuro- grossly intact to observation

## 2014-10-11 NOTE — Assessment & Plan Note (Addendum)
He is comfortable continuing allergy vaccine 1:10 GO Plan-medications discussed. Dymista, Flonase, EpiPen. Refill Keflex to hold at his request with discussion.

## 2014-10-26 DIAGNOSIS — F411 Generalized anxiety disorder: Secondary | ICD-10-CM | POA: Diagnosis not present

## 2014-10-26 DIAGNOSIS — K219 Gastro-esophageal reflux disease without esophagitis: Secondary | ICD-10-CM | POA: Diagnosis not present

## 2014-11-01 DIAGNOSIS — L859 Epidermal thickening, unspecified: Secondary | ICD-10-CM | POA: Diagnosis not present

## 2014-11-01 DIAGNOSIS — M216X1 Other acquired deformities of right foot: Secondary | ICD-10-CM | POA: Diagnosis not present

## 2014-11-01 DIAGNOSIS — M216X2 Other acquired deformities of left foot: Secondary | ICD-10-CM | POA: Diagnosis not present

## 2014-11-01 DIAGNOSIS — M7742 Metatarsalgia, left foot: Secondary | ICD-10-CM | POA: Diagnosis not present

## 2014-11-03 ENCOUNTER — Ambulatory Visit (INDEPENDENT_AMBULATORY_CARE_PROVIDER_SITE_OTHER): Payer: Medicare Other

## 2014-11-03 DIAGNOSIS — J309 Allergic rhinitis, unspecified: Secondary | ICD-10-CM | POA: Diagnosis not present

## 2014-11-03 DIAGNOSIS — K219 Gastro-esophageal reflux disease without esophagitis: Secondary | ICD-10-CM | POA: Diagnosis not present

## 2015-02-27 ENCOUNTER — Other Ambulatory Visit: Payer: Self-pay

## 2015-03-05 ENCOUNTER — Encounter: Payer: Self-pay | Admitting: Internal Medicine

## 2015-03-17 ENCOUNTER — Encounter: Payer: Self-pay | Admitting: Internal Medicine

## 2015-03-17 NOTE — Telephone Encounter (Signed)
[name] is making [name] aware and will also forward email to her

## 2015-04-04 ENCOUNTER — Telehealth: Payer: Self-pay | Admitting: Internal Medicine

## 2015-04-04 ENCOUNTER — Ambulatory Visit (INDEPENDENT_AMBULATORY_CARE_PROVIDER_SITE_OTHER): Payer: Medicare Other

## 2015-04-04 DIAGNOSIS — J309 Allergic rhinitis, unspecified: Secondary | ICD-10-CM

## 2015-04-04 NOTE — Telephone Encounter (Signed)
Date Mixed: 04/04/15 Vial: 2  Strength: 1:10 Here/Mail/Pick Up: mail Mixed By: Laurette Schimke

## 2015-04-05 ENCOUNTER — Encounter: Payer: Self-pay | Admitting: Internal Medicine

## 2015-04-27 ENCOUNTER — Encounter: Payer: Self-pay | Admitting: Internal Medicine

## 2015-06-01 DIAGNOSIS — Z23 Encounter for immunization: Secondary | ICD-10-CM | POA: Diagnosis not present

## 2015-06-21 DIAGNOSIS — R002 Palpitations: Secondary | ICD-10-CM | POA: Diagnosis not present

## 2015-06-21 DIAGNOSIS — F411 Generalized anxiety disorder: Secondary | ICD-10-CM | POA: Diagnosis not present

## 2015-07-06 DIAGNOSIS — L602 Onychogryphosis: Secondary | ICD-10-CM | POA: Diagnosis not present

## 2015-07-06 DIAGNOSIS — M7741 Metatarsalgia, right foot: Secondary | ICD-10-CM | POA: Diagnosis not present

## 2015-07-06 DIAGNOSIS — M216X1 Other acquired deformities of right foot: Secondary | ICD-10-CM | POA: Diagnosis not present

## 2015-07-06 DIAGNOSIS — M7742 Metatarsalgia, left foot: Secondary | ICD-10-CM | POA: Diagnosis not present

## 2015-07-06 DIAGNOSIS — M216X2 Other acquired deformities of left foot: Secondary | ICD-10-CM | POA: Diagnosis not present

## 2015-07-06 DIAGNOSIS — L859 Epidermal thickening, unspecified: Secondary | ICD-10-CM | POA: Diagnosis not present

## 2015-07-10 ENCOUNTER — Encounter: Payer: Self-pay | Admitting: Internal Medicine

## 2015-07-19 ENCOUNTER — Encounter: Payer: Self-pay | Admitting: Internal Medicine

## 2015-07-19 NOTE — Telephone Encounter (Signed)
CY Just want to make sure that you agree with the dosing for allergy vaccine as patient has described as well. Thanks.

## 2015-07-19 NOTE — Telephone Encounter (Signed)
Agree- drop back to 0.4 ml/ vial first week. Then for next and subsequent weeks use 0.5 ml/ vial/ week . If problems please call and let us know.

## 2015-07-20 DIAGNOSIS — F411 Generalized anxiety disorder: Secondary | ICD-10-CM | POA: Diagnosis not present

## 2015-07-20 DIAGNOSIS — I10 Essential (primary) hypertension: Secondary | ICD-10-CM | POA: Diagnosis not present

## 2015-07-20 NOTE — Telephone Encounter (Signed)
Message sent to patient. Nothing more needed at this time.

## 2015-08-14 DIAGNOSIS — L438 Other lichen planus: Secondary | ICD-10-CM | POA: Diagnosis not present

## 2015-08-14 DIAGNOSIS — H11001 Unspecified pterygium of right eye: Secondary | ICD-10-CM | POA: Diagnosis not present

## 2015-08-14 DIAGNOSIS — L439 Lichen planus, unspecified: Secondary | ICD-10-CM | POA: Diagnosis not present

## 2015-08-22 ENCOUNTER — Telehealth: Payer: Self-pay | Admitting: Internal Medicine

## 2015-08-22 NOTE — Telephone Encounter (Signed)
Allergy Serum Extract Date Mixed: 08/22/15 Vial: 2 Strength: 1:10 Here/Mail/Pick Up: mail Mixed By: tbs Last OV: 10/06/14 Pending OV: 10/09/15

## 2015-08-23 ENCOUNTER — Ambulatory Visit (INDEPENDENT_AMBULATORY_CARE_PROVIDER_SITE_OTHER): Payer: Medicare Other

## 2015-08-23 DIAGNOSIS — J309 Allergic rhinitis, unspecified: Secondary | ICD-10-CM | POA: Diagnosis not present

## 2015-09-12 DIAGNOSIS — Z8546 Personal history of malignant neoplasm of prostate: Secondary | ICD-10-CM | POA: Diagnosis not present

## 2015-09-12 DIAGNOSIS — Z0000 Encounter for general adult medical examination without abnormal findings: Secondary | ICD-10-CM | POA: Diagnosis not present

## 2015-09-25 DIAGNOSIS — Z125 Encounter for screening for malignant neoplasm of prostate: Secondary | ICD-10-CM | POA: Diagnosis not present

## 2015-09-25 DIAGNOSIS — I10 Essential (primary) hypertension: Secondary | ICD-10-CM | POA: Diagnosis not present

## 2015-09-25 DIAGNOSIS — K219 Gastro-esophageal reflux disease without esophagitis: Secondary | ICD-10-CM | POA: Diagnosis not present

## 2015-09-25 DIAGNOSIS — Z0000 Encounter for general adult medical examination without abnormal findings: Secondary | ICD-10-CM | POA: Diagnosis not present

## 2015-09-25 DIAGNOSIS — E7800 Pure hypercholesterolemia, unspecified: Secondary | ICD-10-CM | POA: Diagnosis not present

## 2015-09-28 DIAGNOSIS — L570 Actinic keratosis: Secondary | ICD-10-CM | POA: Diagnosis not present

## 2015-09-28 DIAGNOSIS — F411 Generalized anxiety disorder: Secondary | ICD-10-CM | POA: Diagnosis not present

## 2015-09-28 DIAGNOSIS — I4510 Unspecified right bundle-branch block: Secondary | ICD-10-CM | POA: Diagnosis not present

## 2015-09-28 DIAGNOSIS — I10 Essential (primary) hypertension: Secondary | ICD-10-CM | POA: Diagnosis not present

## 2015-09-28 DIAGNOSIS — D696 Thrombocytopenia, unspecified: Secondary | ICD-10-CM | POA: Diagnosis not present

## 2015-10-02 DIAGNOSIS — H2512 Age-related nuclear cataract, left eye: Secondary | ICD-10-CM | POA: Diagnosis not present

## 2015-10-02 DIAGNOSIS — D3132 Benign neoplasm of left choroid: Secondary | ICD-10-CM | POA: Diagnosis not present

## 2015-10-09 ENCOUNTER — Ambulatory Visit (INDEPENDENT_AMBULATORY_CARE_PROVIDER_SITE_OTHER): Payer: Medicare Other | Admitting: Internal Medicine

## 2015-10-09 ENCOUNTER — Encounter: Payer: Self-pay | Admitting: Internal Medicine

## 2015-10-09 VITALS — BP 130/68 | HR 60 | Ht 74.0 in | Wt 204.6 lb

## 2015-10-09 DIAGNOSIS — J301 Allergic rhinitis due to pollen: Secondary | ICD-10-CM | POA: Diagnosis not present

## 2015-10-09 DIAGNOSIS — G4733 Obstructive sleep apnea (adult) (pediatric): Secondary | ICD-10-CM | POA: Diagnosis not present

## 2015-10-09 MED ORDER — "TUBERCULIN-ALLERGY SYRINGES 28G X 1/2"" 1 ML MISC": Status: AC

## 2015-10-09 MED ORDER — EPINEPHRINE 1 MG/ML IJ SOLN: INTRAMUSCULAR | Status: AC

## 2015-10-09 MED ORDER — CEPHALEXIN 500 MG PO CAPS: 500.0000 mg | ORAL_CAPSULE | Freq: Four times a day (QID) | ORAL | Status: AC

## 2015-10-09 MED ORDER — FLUTICASONE PROPIONATE 50 MCG/ACT NA SUSP: 2.0000 | Freq: Every day | NASAL | Status: AC

## 2015-10-09 NOTE — Progress Notes (Signed)
08/08/11- 65 yoM former smoker followed for allergic rhinitis/conjunctivitis, obstructive sleep apnea LOV-08/09/2010 Has had flu vaccine. Continues to do well with allergy shots. Uses Flonase when needed. Continues CPAP at [facility]. He uses it all night every night with no problems. His machine is about 70 years old and we discussed process of replacement when needed.  08/10/12- 56 yoM former smoker followed for allergic rhinitis/conjunctivitis, obstructive sleep apnea FOLLOWS FOR: wears CPAP 5/[facility] with nasal pillows mask every night for about 6 hours and no troubles with pressure; sleeps well. still on allergy vaccine 1:10 GO and doing well. Rare rhinitis flare. Flonase usually works. Occasionally needs Benadryl.  02/03/13- 65 yoM former smoker followed for allergic rhinitis/conjunctivitis, obstructive sleep apnea CPAP 5/ [facility] Dr Shelia Media PCP Comes today to update allergy skin test Using Flonase but no routine antihistamine. Has had more rhinitis symptoms this spring. Compliance with CPAP [place] [facility] remains good. No antihistamines, OTC sleep aids, or OTC cough syrups in past 3 days. Allergy Skin Test 02/03/13-significant for weed and tree pollens, dust mite, several molds and grass pollen. We compared this to his previous vaccine with discussion. He wishes to remix and restart.  04/12/13- 67 yoM former smoker followed for allergic rhinitis/conjunctivitis, obstructive sleep apnea CPAP 5/ [facility] Dr Shelia Media PCP pt reports allergies doing well on vaccine--denies any other concerns at this time Continues building vaccine, now at 1:5000, giving own without problems. We reviewed risks benefits, EpiPen and protocols again. pt reports wearing CPAP 5/ [facility] every night x 6 hrs per night-- tolerating pressures fine, managed by Dr Shelia Media  10/13/13- 67 yoM former smoker followed for allergic rhinitis/conjunctivitis,  obstructive sleep apnea CPAP 5/ [facility] Dr Shelia Media PCP FOLLOWS FOR:  Wearing CPAP 5/ [facility] 6 hours per night,no complaints and does not need new supplies. Allergy Vaccine 1:50  GO doing well. No acute changes and no complaints. He is satisfied to continue. We discussed increasing allergy vaccine up to final maintenance level at 1:10. We discussed pneumococcal vaccine. History of large local reaction previously so I recommended against Prevnar.  10/06/14- 68 yoM former smoker followed for allergic rhinitis/conjunctivitis, obstructive sleep apnea FOLLOWS FOR: Wears CPAP 5/ [facility] every night-Dr Pharr. Continues allergy vaccine and no compliants Allergy vaccine 1:10 GO. History of local reaction but no recent problems. Feels well controlled. Using Dymista nasal spray  10/09/2015-70 year old male former smoker followed for allergic rhinitis/conjunctivitis, OSA CPAP 5/ [facility]- Dr Shelia Media Allergy vaccine 1:10 GO  itching nose and sneezing this morning but overall, doing well.  Still on allergy vaccine.  Wearing cpap nightly - no problems with mask or pressure. Using a [employer] machine for the last 2 years every night and very pleased with it. Allergy vaccine doing well. He is reluctant to stop after symptom flare last time he tried. Uses Flonase if needed. History large local reaction pneumococcal vaccine so we recommended he not repeat.  ROS-see HPI Constitutional:   No-   weight loss, night sweats, fevers, chills, fatigue, lassitude. HEENT:   No-  headaches, difficulty swallowing, tooth/dental problems, sore throat,       No- current sneezing, itching, ear ache, nasal congestion, post nasal drip,  CV:  No-   chest pain, orthopnea, PND, swelling in lower extremities, anasarca, dizziness, palpitations Resp: No-   shortness of breath with exertion or at rest.              No-  productive cough,  No non-productive cough,  No- coughing up of blood.              No-   change in  color of mucus.  No- wheezing.   Skin: No-   rash or lesions. GI:  No-   heartburn, indigestion, abdominal pain, nausea, vomiting,  GU:  MS:  No-   joint pain or swelling.   Neuro-     nothing unusual Psych:  No- change in mood or affect. No depression or anxiety.  No memory loss.  OBJ General- Alert, Oriented, Affect-appropriate, Distress- none acute, tall, thin Skin- +seborrheic keratoses on back Lymphadenopathy- none Head- atraumatic            Eyes- Gross vision intact, PERRLA, conjunctivae clear secretions            Ears- Hearing, canals-normal            Nose- narrow, no-Septal dev, mucus+ bridging, no-polyps, erosion, perforation             Throat- Mallampati II , mucosa clear , drainage- none, tonsils- atrophic Neck- flexible , trachea midline, no stridor , thyroid nl, carotid no bruit Chest - symmetrical excursion , unlabored           Heart/CV- RRR , no murmur , no gallop  , no rub, nl s1 s2                           - JVD- none , edema- none, stasis changes- none, varices- none           Lung- clear to P&A, wheeze- none, cough- none , dullness-none, rub- none           Chest wall-  Abd-  Br/ Gen/ Rectal- Not done, not indicated Extrem- cyanosis- none, clubbing, none, atrophy- none, strength- nl Neuro- grossly intact to observation

## 2015-10-09 NOTE — Patient Instructions (Signed)
We can continue allergy vaccine another year  Scripts for syringes and epinephrine, flonase, keflex  If you need our help with CPAP please let us know

## 2015-10-10 NOTE — Assessment & Plan Note (Signed)
Doing very well with CPAP. He likes his machine and feels his quality of life is quite good using it. Plan-seek download

## 2015-10-10 NOTE — Assessment & Plan Note (Signed)
We can continue allergy vaccine 1 more year as discussed. Refill prescriptions

## 2015-10-25 ENCOUNTER — Encounter: Payer: Self-pay | Admitting: Internal Medicine

## 2015-12-12 DIAGNOSIS — B360 Pityriasis versicolor: Secondary | ICD-10-CM | POA: Diagnosis not present

## 2015-12-12 DIAGNOSIS — L820 Inflamed seborrheic keratosis: Secondary | ICD-10-CM | POA: Diagnosis not present

## 2015-12-27 ENCOUNTER — Telehealth: Payer: Self-pay | Admitting: Internal Medicine

## 2015-12-27 DIAGNOSIS — J309 Allergic rhinitis, unspecified: Secondary | ICD-10-CM

## 2015-12-27 NOTE — Telephone Encounter (Signed)
Allergy Serum Extract Date Mixed: 12/27/15 Vial: 2 Strength: 1:10 Here/Mail/Pick Up: mail Mixed By: tbs Last OV: 10/09/15 Pending OV: 10/08/16

## 2016-02-14 ENCOUNTER — Encounter: Payer: Self-pay | Admitting: Internal Medicine

## 2016-02-14 NOTE — Telephone Encounter (Signed)
6.14.17 mychart e-mail from pt: Message     I have had a lost of nasal dripping the last two days. I have used my Corona but nothing has really helped a lot. I used to take Allegra but currently have no antihistamine on hand except Benadryl. While it can be effective, it puts me in "never[place]." Is there an over-the-counter antihistamine that you would suggest that can help me with this nasal dripping. I cannot take the decongestants (which I do not need anyway) due to blood pressure. I just need a good antihistamine, I guess. I know there is Claritin, etc., but don't know what would help most.        Thank you.        [name] [facility]) [name]    Dr Annamaria Boots please advise, thank you.

## 2016-02-15 NOTE — Telephone Encounter (Signed)
Allegra or Zyrtec should work fine

## 2016-04-08 DIAGNOSIS — M79675 Pain in left toe(s): Secondary | ICD-10-CM | POA: Diagnosis not present

## 2016-04-08 DIAGNOSIS — M7750 Other enthesopathy of unspecified foot: Secondary | ICD-10-CM | POA: Diagnosis not present

## 2016-04-08 DIAGNOSIS — M65872 Other synovitis and tenosynovitis, left ankle and foot: Secondary | ICD-10-CM | POA: Diagnosis not present

## 2016-04-19 DIAGNOSIS — M7750 Other enthesopathy of unspecified foot: Secondary | ICD-10-CM | POA: Diagnosis not present

## 2016-04-19 DIAGNOSIS — M65872 Other synovitis and tenosynovitis, left ankle and foot: Secondary | ICD-10-CM | POA: Diagnosis not present

## 2016-04-23 DIAGNOSIS — S92352A Displaced fracture of fifth metatarsal bone, left foot, initial encounter for closed fracture: Secondary | ICD-10-CM | POA: Diagnosis not present

## 2016-04-23 DIAGNOSIS — M79672 Pain in left foot: Secondary | ICD-10-CM | POA: Diagnosis not present

## 2016-04-24 DIAGNOSIS — K5730 Diverticulosis of large intestine without perforation or abscess without bleeding: Secondary | ICD-10-CM | POA: Diagnosis not present

## 2016-04-24 DIAGNOSIS — Z86010 Personal history of colonic polyps: Secondary | ICD-10-CM | POA: Diagnosis not present

## 2016-04-25 DIAGNOSIS — S92345A Nondisplaced fracture of fourth metatarsal bone, left foot, initial encounter for closed fracture: Secondary | ICD-10-CM | POA: Diagnosis not present

## 2016-04-29 ENCOUNTER — Encounter: Payer: Self-pay | Admitting: Internal Medicine

## 2016-05-01 ENCOUNTER — Telehealth: Payer: Self-pay | Admitting: Internal Medicine

## 2016-05-01 DIAGNOSIS — J309 Allergic rhinitis, unspecified: Secondary | ICD-10-CM | POA: Diagnosis not present

## 2016-05-01 NOTE — Telephone Encounter (Signed)
Allergy Serum Extract Date Mixed: 05/01/16 Vial: 2 Strength: 1:10 Here/Mail/Pick Up: mail Mixed By: tbs Last OV: 10/09/15 Pending OV: 10/08/16

## 2016-05-09 DIAGNOSIS — S92302D Fracture of unspecified metatarsal bone(s), left foot, subsequent encounter for fracture with routine healing: Secondary | ICD-10-CM | POA: Diagnosis not present

## 2016-05-30 DIAGNOSIS — S92302D Fracture of unspecified metatarsal bone(s), left foot, subsequent encounter for fracture with routine healing: Secondary | ICD-10-CM | POA: Diagnosis not present

## 2016-06-07 DIAGNOSIS — Z23 Encounter for immunization: Secondary | ICD-10-CM | POA: Diagnosis not present

## 2016-07-08 DIAGNOSIS — S92342D Displaced fracture of fourth metatarsal bone, left foot, subsequent encounter for fracture with routine healing: Secondary | ICD-10-CM | POA: Diagnosis not present

## 2016-08-12 ENCOUNTER — Encounter: Payer: Self-pay | Admitting: Internal Medicine

## 2016-08-15 ENCOUNTER — Telehealth: Payer: Self-pay | Admitting: *Deleted

## 2016-08-15 NOTE — Telephone Encounter (Signed)
Called pt. Asked him how much he had left, enough for 3 or 4 more shots. Per CY take shots every other wk., the vac. Will last til January. Pt. Has an appointment with an allergist in [facility]. Nothing further needed.

## 2016-09-05 DIAGNOSIS — H5213 Myopia, bilateral: Secondary | ICD-10-CM | POA: Diagnosis not present

## 2016-09-05 DIAGNOSIS — H2513 Age-related nuclear cataract, bilateral: Secondary | ICD-10-CM | POA: Diagnosis not present

## 2016-09-05 DIAGNOSIS — H524 Presbyopia: Secondary | ICD-10-CM | POA: Diagnosis not present

## 2016-09-05 DIAGNOSIS — D3132 Benign neoplasm of left choroid: Secondary | ICD-10-CM | POA: Diagnosis not present

## 2016-09-05 DIAGNOSIS — H52221 Regular astigmatism, right eye: Secondary | ICD-10-CM | POA: Diagnosis not present

## 2016-09-10 DIAGNOSIS — E785 Hyperlipidemia, unspecified: Secondary | ICD-10-CM | POA: Diagnosis not present

## 2016-09-10 DIAGNOSIS — Z7982 Long term (current) use of aspirin: Secondary | ICD-10-CM | POA: Diagnosis not present

## 2016-09-10 DIAGNOSIS — Z87891 Personal history of nicotine dependence: Secondary | ICD-10-CM | POA: Diagnosis not present

## 2016-09-10 DIAGNOSIS — Z882 Allergy status to sulfonamides status: Secondary | ICD-10-CM | POA: Diagnosis not present

## 2016-09-10 DIAGNOSIS — Z885 Allergy status to narcotic agent status: Secondary | ICD-10-CM | POA: Diagnosis not present

## 2016-09-10 DIAGNOSIS — Z881 Allergy status to other antibiotic agents status: Secondary | ICD-10-CM | POA: Diagnosis not present

## 2016-09-10 DIAGNOSIS — I10 Essential (primary) hypertension: Secondary | ICD-10-CM | POA: Diagnosis not present

## 2016-09-10 DIAGNOSIS — J302 Other seasonal allergic rhinitis: Secondary | ICD-10-CM | POA: Diagnosis not present

## 2016-09-10 DIAGNOSIS — Z79899 Other long term (current) drug therapy: Secondary | ICD-10-CM | POA: Diagnosis not present

## 2016-09-10 DIAGNOSIS — Z880 Allergy status to penicillin: Secondary | ICD-10-CM | POA: Diagnosis not present

## 2016-09-12 DIAGNOSIS — Z8546 Personal history of malignant neoplasm of prostate: Secondary | ICD-10-CM | POA: Diagnosis not present

## 2016-09-12 DIAGNOSIS — C61 Malignant neoplasm of prostate: Secondary | ICD-10-CM | POA: Diagnosis not present

## 2016-09-12 DIAGNOSIS — N4340 Spermatocele of epididymis, unspecified: Secondary | ICD-10-CM | POA: Diagnosis not present

## 2016-10-01 DIAGNOSIS — D696 Thrombocytopenia, unspecified: Secondary | ICD-10-CM | POA: Diagnosis not present

## 2016-10-01 DIAGNOSIS — E7800 Pure hypercholesterolemia, unspecified: Secondary | ICD-10-CM | POA: Diagnosis not present

## 2016-10-01 DIAGNOSIS — I10 Essential (primary) hypertension: Secondary | ICD-10-CM | POA: Diagnosis not present

## 2016-10-01 DIAGNOSIS — Z125 Encounter for screening for malignant neoplasm of prostate: Secondary | ICD-10-CM | POA: Diagnosis not present

## 2016-10-01 DIAGNOSIS — Z0000 Encounter for general adult medical examination without abnormal findings: Secondary | ICD-10-CM | POA: Diagnosis not present

## 2016-10-03 DIAGNOSIS — K219 Gastro-esophageal reflux disease without esophagitis: Secondary | ICD-10-CM | POA: Diagnosis not present

## 2016-10-03 DIAGNOSIS — G4733 Obstructive sleep apnea (adult) (pediatric): Secondary | ICD-10-CM | POA: Diagnosis not present

## 2016-10-03 DIAGNOSIS — Z1212 Encounter for screening for malignant neoplasm of rectum: Secondary | ICD-10-CM | POA: Diagnosis not present

## 2016-10-03 DIAGNOSIS — Z8659 Personal history of other mental and behavioral disorders: Secondary | ICD-10-CM | POA: Diagnosis not present

## 2016-10-03 DIAGNOSIS — E7800 Pure hypercholesterolemia, unspecified: Secondary | ICD-10-CM | POA: Diagnosis not present

## 2016-10-03 DIAGNOSIS — I10 Essential (primary) hypertension: Secondary | ICD-10-CM | POA: Diagnosis not present

## 2016-10-03 DIAGNOSIS — L570 Actinic keratosis: Secondary | ICD-10-CM | POA: Diagnosis not present

## 2016-10-03 DIAGNOSIS — D696 Thrombocytopenia, unspecified: Secondary | ICD-10-CM | POA: Diagnosis not present

## 2016-10-08 ENCOUNTER — Ambulatory Visit: Payer: PRIVATE HEALTH INSURANCE | Admitting: Internal Medicine

## 2016-10-28 DIAGNOSIS — D696 Thrombocytopenia, unspecified: Secondary | ICD-10-CM | POA: Diagnosis not present

## 2016-11-11 DIAGNOSIS — D696 Thrombocytopenia, unspecified: Secondary | ICD-10-CM | POA: Diagnosis not present

## 2016-12-11 DIAGNOSIS — Z881 Allergy status to other antibiotic agents status: Secondary | ICD-10-CM | POA: Diagnosis not present

## 2016-12-11 DIAGNOSIS — Z79899 Other long term (current) drug therapy: Secondary | ICD-10-CM | POA: Diagnosis not present

## 2016-12-11 DIAGNOSIS — Z87891 Personal history of nicotine dependence: Secondary | ICD-10-CM | POA: Diagnosis not present

## 2016-12-11 DIAGNOSIS — Z880 Allergy status to penicillin: Secondary | ICD-10-CM | POA: Diagnosis not present

## 2016-12-11 DIAGNOSIS — Z888 Allergy status to other drugs, medicaments and biological substances status: Secondary | ICD-10-CM | POA: Diagnosis not present

## 2016-12-11 DIAGNOSIS — Z8546 Personal history of malignant neoplasm of prostate: Secondary | ICD-10-CM | POA: Diagnosis not present

## 2016-12-11 DIAGNOSIS — Z7982 Long term (current) use of aspirin: Secondary | ICD-10-CM | POA: Diagnosis not present

## 2016-12-11 DIAGNOSIS — I10 Essential (primary) hypertension: Secondary | ICD-10-CM | POA: Diagnosis not present

## 2016-12-11 DIAGNOSIS — J310 Chronic rhinitis: Secondary | ICD-10-CM | POA: Diagnosis not present

## 2016-12-11 DIAGNOSIS — E7800 Pure hypercholesterolemia, unspecified: Secondary | ICD-10-CM | POA: Diagnosis not present

## 2016-12-11 DIAGNOSIS — Z886 Allergy status to analgesic agent status: Secondary | ICD-10-CM | POA: Diagnosis not present

## 2016-12-11 DIAGNOSIS — E785 Hyperlipidemia, unspecified: Secondary | ICD-10-CM | POA: Diagnosis not present

## 2017-01-08 DIAGNOSIS — L308 Other specified dermatitis: Secondary | ICD-10-CM | POA: Diagnosis not present

## 2017-01-08 DIAGNOSIS — L821 Other seborrheic keratosis: Secondary | ICD-10-CM | POA: Diagnosis not present

## 2017-01-08 DIAGNOSIS — L820 Inflamed seborrheic keratosis: Secondary | ICD-10-CM | POA: Diagnosis not present

## 2017-01-08 DIAGNOSIS — L304 Erythema intertrigo: Secondary | ICD-10-CM | POA: Diagnosis not present

## 2017-01-08 DIAGNOSIS — D225 Melanocytic nevi of trunk: Secondary | ICD-10-CM | POA: Diagnosis not present

## 2017-02-25 DIAGNOSIS — S20212A Contusion of left front wall of thorax, initial encounter: Secondary | ICD-10-CM | POA: Diagnosis not present

## 2017-02-25 DIAGNOSIS — S299XXA Unspecified injury of thorax, initial encounter: Secondary | ICD-10-CM | POA: Diagnosis not present

## 2017-02-25 DIAGNOSIS — R58 Hemorrhage, not elsewhere classified: Secondary | ICD-10-CM | POA: Diagnosis not present

## 2017-02-25 DIAGNOSIS — R0781 Pleurodynia: Secondary | ICD-10-CM | POA: Diagnosis not present

## 2017-05-22 DIAGNOSIS — Z23 Encounter for immunization: Secondary | ICD-10-CM | POA: Diagnosis not present

## 2017-05-26 DIAGNOSIS — Z23 Encounter for immunization: Secondary | ICD-10-CM | POA: Diagnosis not present

## 2017-08-21 DIAGNOSIS — L309 Dermatitis, unspecified: Secondary | ICD-10-CM | POA: Diagnosis not present

## 2017-08-21 DIAGNOSIS — L439 Lichen planus, unspecified: Secondary | ICD-10-CM | POA: Diagnosis not present

## 2017-09-15 DIAGNOSIS — N4340 Spermatocele of epididymis, unspecified: Secondary | ICD-10-CM | POA: Diagnosis not present

## 2017-09-15 DIAGNOSIS — R972 Elevated prostate specific antigen [PSA]: Secondary | ICD-10-CM | POA: Diagnosis not present

## 2017-09-15 DIAGNOSIS — Z8546 Personal history of malignant neoplasm of prostate: Secondary | ICD-10-CM | POA: Diagnosis not present

## 2017-10-03 DIAGNOSIS — I10 Essential (primary) hypertension: Secondary | ICD-10-CM | POA: Diagnosis not present

## 2017-10-03 DIAGNOSIS — D696 Thrombocytopenia, unspecified: Secondary | ICD-10-CM | POA: Diagnosis not present

## 2017-10-03 DIAGNOSIS — Z125 Encounter for screening for malignant neoplasm of prostate: Secondary | ICD-10-CM | POA: Diagnosis not present

## 2017-10-03 DIAGNOSIS — K219 Gastro-esophageal reflux disease without esophagitis: Secondary | ICD-10-CM | POA: Diagnosis not present

## 2017-10-09 DIAGNOSIS — R002 Palpitations: Secondary | ICD-10-CM | POA: Diagnosis not present

## 2017-10-09 DIAGNOSIS — G43109 Migraine with aura, not intractable, without status migrainosus: Secondary | ICD-10-CM | POA: Diagnosis not present

## 2017-10-09 DIAGNOSIS — M810 Age-related osteoporosis without current pathological fracture: Secondary | ICD-10-CM | POA: Diagnosis not present

## 2017-10-09 DIAGNOSIS — M8000XA Age-related osteoporosis with current pathological fracture, unspecified site, initial encounter for fracture: Secondary | ICD-10-CM | POA: Diagnosis not present

## 2017-10-09 DIAGNOSIS — J309 Allergic rhinitis, unspecified: Secondary | ICD-10-CM | POA: Diagnosis not present

## 2017-10-09 DIAGNOSIS — N529 Male erectile dysfunction, unspecified: Secondary | ICD-10-CM | POA: Diagnosis not present

## 2017-10-09 DIAGNOSIS — I10 Essential (primary) hypertension: Secondary | ICD-10-CM | POA: Diagnosis not present

## 2017-10-09 DIAGNOSIS — K219 Gastro-esophageal reflux disease without esophagitis: Secondary | ICD-10-CM | POA: Diagnosis not present

## 2017-10-09 DIAGNOSIS — K449 Diaphragmatic hernia without obstruction or gangrene: Secondary | ICD-10-CM | POA: Diagnosis not present

## 2017-10-09 DIAGNOSIS — E7800 Pure hypercholesterolemia, unspecified: Secondary | ICD-10-CM | POA: Diagnosis not present

## 2017-10-09 DIAGNOSIS — R011 Cardiac murmur, unspecified: Secondary | ICD-10-CM | POA: Diagnosis not present

## 2017-10-09 DIAGNOSIS — D696 Thrombocytopenia, unspecified: Secondary | ICD-10-CM | POA: Diagnosis not present

## 2017-10-09 DIAGNOSIS — I4510 Unspecified right bundle-branch block: Secondary | ICD-10-CM | POA: Diagnosis not present

## 2017-10-22 DIAGNOSIS — R011 Cardiac murmur, unspecified: Secondary | ICD-10-CM | POA: Diagnosis not present

## 2017-11-04 DIAGNOSIS — H2513 Age-related nuclear cataract, bilateral: Secondary | ICD-10-CM | POA: Diagnosis not present

## 2017-11-04 DIAGNOSIS — D3132 Benign neoplasm of left choroid: Secondary | ICD-10-CM | POA: Diagnosis not present

## 2017-12-29 DIAGNOSIS — F410 Panic disorder [episodic paroxysmal anxiety] without agoraphobia: Secondary | ICD-10-CM | POA: Diagnosis not present

## 2017-12-29 DIAGNOSIS — K219 Gastro-esophageal reflux disease without esophagitis: Secondary | ICD-10-CM | POA: Diagnosis not present

## 2018-01-23 DIAGNOSIS — H21233 Degeneration of iris (pigmentary), bilateral: Secondary | ICD-10-CM | POA: Diagnosis not present

## 2018-01-23 DIAGNOSIS — H25813 Combined forms of age-related cataract, bilateral: Secondary | ICD-10-CM | POA: Diagnosis not present

## 2018-02-10 DIAGNOSIS — L859 Epidermal thickening, unspecified: Secondary | ICD-10-CM | POA: Diagnosis not present

## 2018-02-24 DIAGNOSIS — L570 Actinic keratosis: Secondary | ICD-10-CM | POA: Diagnosis not present

## 2018-02-24 DIAGNOSIS — D225 Melanocytic nevi of trunk: Secondary | ICD-10-CM | POA: Diagnosis not present

## 2018-02-24 DIAGNOSIS — L821 Other seborrheic keratosis: Secondary | ICD-10-CM | POA: Diagnosis not present

## 2018-02-24 DIAGNOSIS — L820 Inflamed seborrheic keratosis: Secondary | ICD-10-CM | POA: Diagnosis not present

## 2018-02-24 DIAGNOSIS — X32XXXD Exposure to sunlight, subsequent encounter: Secondary | ICD-10-CM | POA: Diagnosis not present

## 2018-02-24 DIAGNOSIS — I872 Venous insufficiency (chronic) (peripheral): Secondary | ICD-10-CM | POA: Diagnosis not present

## 2018-03-18 DIAGNOSIS — Z888 Allergy status to other drugs, medicaments and biological substances status: Secondary | ICD-10-CM | POA: Diagnosis not present

## 2018-03-18 DIAGNOSIS — K219 Gastro-esophageal reflux disease without esophagitis: Secondary | ICD-10-CM | POA: Diagnosis not present

## 2018-03-18 DIAGNOSIS — G4733 Obstructive sleep apnea (adult) (pediatric): Secondary | ICD-10-CM | POA: Diagnosis not present

## 2018-03-18 DIAGNOSIS — Z881 Allergy status to other antibiotic agents status: Secondary | ICD-10-CM | POA: Diagnosis not present

## 2018-03-18 DIAGNOSIS — H2511 Age-related nuclear cataract, right eye: Secondary | ICD-10-CM | POA: Diagnosis not present

## 2018-03-18 DIAGNOSIS — Z880 Allergy status to penicillin: Secondary | ICD-10-CM | POA: Diagnosis not present

## 2018-03-18 DIAGNOSIS — F419 Anxiety disorder, unspecified: Secondary | ICD-10-CM | POA: Diagnosis not present

## 2018-03-18 DIAGNOSIS — I10 Essential (primary) hypertension: Secondary | ICD-10-CM | POA: Diagnosis not present

## 2018-03-18 DIAGNOSIS — Z7982 Long term (current) use of aspirin: Secondary | ICD-10-CM | POA: Diagnosis not present

## 2018-03-18 DIAGNOSIS — Z79899 Other long term (current) drug therapy: Secondary | ICD-10-CM | POA: Diagnosis not present

## 2018-03-18 DIAGNOSIS — Z87891 Personal history of nicotine dependence: Secondary | ICD-10-CM | POA: Diagnosis not present

## 2018-05-06 DIAGNOSIS — I10 Essential (primary) hypertension: Secondary | ICD-10-CM | POA: Diagnosis not present

## 2018-05-06 DIAGNOSIS — G4733 Obstructive sleep apnea (adult) (pediatric): Secondary | ICD-10-CM | POA: Diagnosis not present

## 2018-05-06 DIAGNOSIS — I4510 Unspecified right bundle-branch block: Secondary | ICD-10-CM | POA: Diagnosis not present

## 2018-05-06 DIAGNOSIS — R011 Cardiac murmur, unspecified: Secondary | ICD-10-CM | POA: Diagnosis not present

## 2018-05-06 DIAGNOSIS — Z9841 Cataract extraction status, right eye: Secondary | ICD-10-CM | POA: Diagnosis not present

## 2018-05-06 DIAGNOSIS — Z79899 Other long term (current) drug therapy: Secondary | ICD-10-CM | POA: Diagnosis not present

## 2018-05-06 DIAGNOSIS — Z7982 Long term (current) use of aspirin: Secondary | ICD-10-CM | POA: Diagnosis not present

## 2018-05-06 DIAGNOSIS — Z885 Allergy status to narcotic agent status: Secondary | ICD-10-CM | POA: Diagnosis not present

## 2018-05-06 DIAGNOSIS — F419 Anxiety disorder, unspecified: Secondary | ICD-10-CM | POA: Diagnosis not present

## 2018-05-06 DIAGNOSIS — I340 Nonrheumatic mitral (valve) insufficiency: Secondary | ICD-10-CM | POA: Diagnosis not present

## 2018-05-06 DIAGNOSIS — Z8546 Personal history of malignant neoplasm of prostate: Secondary | ICD-10-CM | POA: Diagnosis not present

## 2018-05-06 DIAGNOSIS — Z880 Allergy status to penicillin: Secondary | ICD-10-CM | POA: Diagnosis not present

## 2018-05-06 DIAGNOSIS — Z961 Presence of intraocular lens: Secondary | ICD-10-CM | POA: Diagnosis not present

## 2018-05-06 DIAGNOSIS — Z881 Allergy status to other antibiotic agents status: Secondary | ICD-10-CM | POA: Diagnosis not present

## 2018-05-06 DIAGNOSIS — Z882 Allergy status to sulfonamides status: Secondary | ICD-10-CM | POA: Diagnosis not present

## 2018-05-06 DIAGNOSIS — H2512 Age-related nuclear cataract, left eye: Secondary | ICD-10-CM | POA: Diagnosis not present

## 2018-05-06 DIAGNOSIS — K219 Gastro-esophageal reflux disease without esophagitis: Secondary | ICD-10-CM | POA: Diagnosis not present

## 2018-05-06 DIAGNOSIS — Z87891 Personal history of nicotine dependence: Secondary | ICD-10-CM | POA: Diagnosis not present

## 2018-06-08 DIAGNOSIS — Z23 Encounter for immunization: Secondary | ICD-10-CM | POA: Diagnosis not present

## 2018-07-07 DIAGNOSIS — M790 Rheumatism, unspecified: Secondary | ICD-10-CM | POA: Diagnosis not present

## 2018-07-07 DIAGNOSIS — M791 Myalgia, unspecified site: Secondary | ICD-10-CM | POA: Diagnosis not present

## 2018-09-15 DIAGNOSIS — M722 Plantar fascial fibromatosis: Secondary | ICD-10-CM | POA: Diagnosis not present

## 2018-09-16 DIAGNOSIS — C61 Malignant neoplasm of prostate: Secondary | ICD-10-CM | POA: Diagnosis not present

## 2018-09-16 DIAGNOSIS — N4340 Spermatocele of epididymis, unspecified: Secondary | ICD-10-CM | POA: Diagnosis not present

## 2018-09-16 DIAGNOSIS — Z8546 Personal history of malignant neoplasm of prostate: Secondary | ICD-10-CM | POA: Diagnosis not present

## 2018-10-08 DIAGNOSIS — D696 Thrombocytopenia, unspecified: Secondary | ICD-10-CM | POA: Diagnosis not present

## 2018-10-08 DIAGNOSIS — Z125 Encounter for screening for malignant neoplasm of prostate: Secondary | ICD-10-CM | POA: Diagnosis not present

## 2018-10-08 DIAGNOSIS — I10 Essential (primary) hypertension: Secondary | ICD-10-CM | POA: Diagnosis not present

## 2018-10-08 DIAGNOSIS — E7800 Pure hypercholesterolemia, unspecified: Secondary | ICD-10-CM | POA: Diagnosis not present

## 2018-10-12 DIAGNOSIS — K219 Gastro-esophageal reflux disease without esophagitis: Secondary | ICD-10-CM | POA: Diagnosis not present

## 2018-10-12 DIAGNOSIS — R002 Palpitations: Secondary | ICD-10-CM | POA: Diagnosis not present

## 2018-10-12 DIAGNOSIS — Z7289 Other problems related to lifestyle: Secondary | ICD-10-CM | POA: Diagnosis not present

## 2018-10-12 DIAGNOSIS — Z1159 Encounter for screening for other viral diseases: Secondary | ICD-10-CM | POA: Diagnosis not present

## 2018-10-12 DIAGNOSIS — I4510 Unspecified right bundle-branch block: Secondary | ICD-10-CM | POA: Diagnosis not present

## 2018-10-12 DIAGNOSIS — D696 Thrombocytopenia, unspecified: Secondary | ICD-10-CM | POA: Diagnosis not present

## 2018-10-12 DIAGNOSIS — I10 Essential (primary) hypertension: Secondary | ICD-10-CM | POA: Diagnosis not present

## 2018-10-12 DIAGNOSIS — E7800 Pure hypercholesterolemia, unspecified: Secondary | ICD-10-CM | POA: Diagnosis not present

## 2018-10-12 DIAGNOSIS — J309 Allergic rhinitis, unspecified: Secondary | ICD-10-CM | POA: Diagnosis not present

## 2018-10-12 DIAGNOSIS — M8000XA Age-related osteoporosis with current pathological fracture, unspecified site, initial encounter for fracture: Secondary | ICD-10-CM | POA: Diagnosis not present

## 2018-10-12 DIAGNOSIS — F411 Generalized anxiety disorder: Secondary | ICD-10-CM | POA: Diagnosis not present

## 2018-10-12 DIAGNOSIS — G4733 Obstructive sleep apnea (adult) (pediatric): Secondary | ICD-10-CM | POA: Diagnosis not present

## 2018-10-12 DIAGNOSIS — Z87891 Personal history of nicotine dependence: Secondary | ICD-10-CM | POA: Diagnosis not present

## 2018-10-12 DIAGNOSIS — I351 Nonrheumatic aortic (valve) insufficiency: Secondary | ICD-10-CM | POA: Diagnosis not present

## 2018-10-15 ENCOUNTER — Other Ambulatory Visit: Payer: Self-pay | Admitting: Internal Medicine

## 2018-10-15 DIAGNOSIS — I351 Nonrheumatic aortic (valve) insufficiency: Secondary | ICD-10-CM

## 2018-10-19 ENCOUNTER — Ambulatory Visit: Payer: Medicare Other

## 2018-10-19 DIAGNOSIS — I351 Nonrheumatic aortic (valve) insufficiency: Secondary | ICD-10-CM

## 2018-10-19 DIAGNOSIS — M722 Plantar fascial fibromatosis: Secondary | ICD-10-CM | POA: Diagnosis not present

## 2018-10-20 ENCOUNTER — Telehealth: Payer: Self-pay | Admitting: Cardiology

## 2018-10-20 NOTE — Telephone Encounter (Signed)
Normal heart function. Moderate AI,  aortic insufficiency   Aortic valve is between the main chamber of the heart and the aorta. Its job is to prevent blood leaking back into the heart when the heart has finished pumping the blood out and is relaxing.  ar= Aortic regurgitation. This valve is leaking blood back to to the left heart. It is mostly not very improtant unless it is moderate or severe. Most AR (aortic regurgitation) remains very stable for years. Valve needs to be replaced only for severe AR.

## 2018-10-22 NOTE — Telephone Encounter (Signed)
Left message for pt to return call.

## 2019-01-19 DIAGNOSIS — D3132 Benign neoplasm of left choroid: Secondary | ICD-10-CM | POA: Diagnosis not present

## 2019-01-19 DIAGNOSIS — H401321 Pigmentary glaucoma, left eye, mild stage: Secondary | ICD-10-CM | POA: Diagnosis not present

## 2019-01-19 DIAGNOSIS — H401311 Pigmentary glaucoma, right eye, mild stage: Secondary | ICD-10-CM | POA: Diagnosis not present

## 2019-01-19 DIAGNOSIS — H35373 Puckering of macula, bilateral: Secondary | ICD-10-CM | POA: Diagnosis not present

## 2019-04-05 ENCOUNTER — Other Ambulatory Visit: Payer: Self-pay

## 2019-04-28 DIAGNOSIS — C44319 Basal cell carcinoma of skin of other parts of face: Secondary | ICD-10-CM | POA: Diagnosis not present

## 2019-04-28 DIAGNOSIS — X32XXXD Exposure to sunlight, subsequent encounter: Secondary | ICD-10-CM | POA: Diagnosis not present

## 2019-04-28 DIAGNOSIS — L570 Actinic keratosis: Secondary | ICD-10-CM | POA: Diagnosis not present

## 2019-04-28 DIAGNOSIS — L218 Other seborrheic dermatitis: Secondary | ICD-10-CM | POA: Diagnosis not present

## 2019-04-28 DIAGNOSIS — L304 Erythema intertrigo: Secondary | ICD-10-CM | POA: Diagnosis not present

## 2019-04-28 DIAGNOSIS — L438 Other lichen planus: Secondary | ICD-10-CM | POA: Diagnosis not present

## 2019-05-18 DIAGNOSIS — C44319 Basal cell carcinoma of skin of other parts of face: Secondary | ICD-10-CM | POA: Diagnosis not present

## 2019-05-19 DIAGNOSIS — Z23 Encounter for immunization: Secondary | ICD-10-CM | POA: Diagnosis not present

## 2019-06-16 DIAGNOSIS — L821 Other seborrheic keratosis: Secondary | ICD-10-CM | POA: Diagnosis not present

## 2019-06-16 DIAGNOSIS — C44319 Basal cell carcinoma of skin of other parts of face: Secondary | ICD-10-CM | POA: Diagnosis not present

## 2019-06-16 DIAGNOSIS — D225 Melanocytic nevi of trunk: Secondary | ICD-10-CM | POA: Diagnosis not present

## 2019-07-09 DIAGNOSIS — H35373 Puckering of macula, bilateral: Secondary | ICD-10-CM | POA: Diagnosis not present

## 2019-07-09 DIAGNOSIS — H26493 Other secondary cataract, bilateral: Secondary | ICD-10-CM | POA: Diagnosis not present

## 2019-07-09 DIAGNOSIS — H401321 Pigmentary glaucoma, left eye, mild stage: Secondary | ICD-10-CM | POA: Diagnosis not present

## 2019-07-09 DIAGNOSIS — H401311 Pigmentary glaucoma, right eye, mild stage: Secondary | ICD-10-CM | POA: Diagnosis not present

## 2019-07-09 DIAGNOSIS — D3132 Benign neoplasm of left choroid: Secondary | ICD-10-CM | POA: Diagnosis not present

## 2019-08-17 DIAGNOSIS — Z08 Encounter for follow-up examination after completed treatment for malignant neoplasm: Secondary | ICD-10-CM | POA: Diagnosis not present

## 2019-08-17 DIAGNOSIS — Z85828 Personal history of other malignant neoplasm of skin: Secondary | ICD-10-CM | POA: Diagnosis not present

## 2019-08-17 DIAGNOSIS — L820 Inflamed seborrheic keratosis: Secondary | ICD-10-CM | POA: Diagnosis not present

## 2020-01-01 DIAGNOSIS — M6281 Muscle weakness (generalized): Secondary | ICD-10-CM | POA: Diagnosis not present

## 2020-01-01 DIAGNOSIS — J984 Other disorders of lung: Secondary | ICD-10-CM | POA: Diagnosis not present

## 2020-01-01 DIAGNOSIS — S72011A Unspecified intracapsular fracture of right femur, initial encounter for closed fracture: Secondary | ICD-10-CM | POA: Diagnosis not present

## 2020-01-01 DIAGNOSIS — S72091A Other fracture of head and neck of right femur, initial encounter for closed fracture: Secondary | ICD-10-CM | POA: Diagnosis not present

## 2020-01-01 DIAGNOSIS — I10 Essential (primary) hypertension: Secondary | ICD-10-CM | POA: Diagnosis not present

## 2020-01-01 DIAGNOSIS — R2689 Other abnormalities of gait and mobility: Secondary | ICD-10-CM | POA: Diagnosis not present

## 2020-01-01 DIAGNOSIS — S52591A Other fractures of lower end of right radius, initial encounter for closed fracture: Secondary | ICD-10-CM | POA: Diagnosis not present

## 2020-01-01 DIAGNOSIS — W19XXXA Unspecified fall, initial encounter: Secondary | ICD-10-CM | POA: Diagnosis not present

## 2020-01-01 DIAGNOSIS — Z471 Aftercare following joint replacement surgery: Secondary | ICD-10-CM | POA: Diagnosis not present

## 2020-01-01 DIAGNOSIS — Z96641 Presence of right artificial hip joint: Secondary | ICD-10-CM | POA: Diagnosis not present

## 2020-01-01 DIAGNOSIS — W11XXXA Fall on and from ladder, initial encounter: Secondary | ICD-10-CM | POA: Diagnosis not present

## 2020-01-01 DIAGNOSIS — R52 Pain, unspecified: Secondary | ICD-10-CM | POA: Diagnosis not present

## 2020-01-01 DIAGNOSIS — Z01818 Encounter for other preprocedural examination: Secondary | ICD-10-CM | POA: Diagnosis not present

## 2020-01-01 DIAGNOSIS — S72001S Fracture of unspecified part of neck of right femur, sequela: Secondary | ICD-10-CM | POA: Diagnosis not present

## 2020-01-01 DIAGNOSIS — G4730 Sleep apnea, unspecified: Secondary | ICD-10-CM | POA: Diagnosis not present

## 2020-01-01 DIAGNOSIS — Z87891 Personal history of nicotine dependence: Secondary | ICD-10-CM | POA: Diagnosis not present

## 2020-01-01 DIAGNOSIS — G4733 Obstructive sleep apnea (adult) (pediatric): Secondary | ICD-10-CM | POA: Diagnosis not present

## 2020-01-01 DIAGNOSIS — K5900 Constipation, unspecified: Secondary | ICD-10-CM | POA: Diagnosis not present

## 2020-01-01 DIAGNOSIS — S52301A Unspecified fracture of shaft of right radius, initial encounter for closed fracture: Secondary | ICD-10-CM | POA: Diagnosis not present

## 2020-01-01 DIAGNOSIS — R609 Edema, unspecified: Secondary | ICD-10-CM | POA: Diagnosis not present

## 2020-01-01 DIAGNOSIS — Z9989 Dependence on other enabling machines and devices: Secondary | ICD-10-CM | POA: Diagnosis not present

## 2020-01-01 DIAGNOSIS — I4510 Unspecified right bundle-branch block: Secondary | ICD-10-CM | POA: Diagnosis not present

## 2020-01-01 DIAGNOSIS — F419 Anxiety disorder, unspecified: Secondary | ICD-10-CM | POA: Diagnosis not present

## 2020-01-01 DIAGNOSIS — K219 Gastro-esophageal reflux disease without esophagitis: Secondary | ICD-10-CM | POA: Diagnosis not present

## 2020-01-01 DIAGNOSIS — S72001A Fracture of unspecified part of neck of right femur, initial encounter for closed fracture: Secondary | ICD-10-CM | POA: Diagnosis not present

## 2020-01-01 DIAGNOSIS — S72009S Fracture of unspecified part of neck of unspecified femur, sequela: Secondary | ICD-10-CM | POA: Diagnosis not present

## 2020-01-01 DIAGNOSIS — D62 Acute posthemorrhagic anemia: Secondary | ICD-10-CM | POA: Diagnosis not present

## 2020-01-01 DIAGNOSIS — R0602 Shortness of breath: Secondary | ICD-10-CM | POA: Diagnosis not present

## 2020-01-01 DIAGNOSIS — S52501S Unspecified fracture of the lower end of right radius, sequela: Secondary | ICD-10-CM | POA: Diagnosis not present

## 2020-01-01 DIAGNOSIS — S52501A Unspecified fracture of the lower end of right radius, initial encounter for closed fracture: Secondary | ICD-10-CM | POA: Diagnosis not present

## 2020-01-06 DIAGNOSIS — S72009S Fracture of unspecified part of neck of unspecified femur, sequela: Secondary | ICD-10-CM | POA: Diagnosis not present

## 2020-01-06 DIAGNOSIS — S52501D Unspecified fracture of the lower end of right radius, subsequent encounter for closed fracture with routine healing: Secondary | ICD-10-CM | POA: Diagnosis not present

## 2020-01-06 DIAGNOSIS — S72001S Fracture of unspecified part of neck of right femur, sequela: Secondary | ICD-10-CM | POA: Diagnosis not present

## 2020-01-06 DIAGNOSIS — S52501S Unspecified fracture of the lower end of right radius, sequela: Secondary | ICD-10-CM | POA: Diagnosis not present

## 2020-01-06 DIAGNOSIS — I4510 Unspecified right bundle-branch block: Secondary | ICD-10-CM | POA: Diagnosis not present

## 2020-01-06 DIAGNOSIS — M6281 Muscle weakness (generalized): Secondary | ICD-10-CM | POA: Diagnosis not present

## 2020-01-06 DIAGNOSIS — Z471 Aftercare following joint replacement surgery: Secondary | ICD-10-CM | POA: Diagnosis not present

## 2020-01-06 DIAGNOSIS — I10 Essential (primary) hypertension: Secondary | ICD-10-CM | POA: Diagnosis not present

## 2020-01-06 DIAGNOSIS — S72001D Fracture of unspecified part of neck of right femur, subsequent encounter for closed fracture with routine healing: Secondary | ICD-10-CM | POA: Diagnosis not present

## 2020-01-06 DIAGNOSIS — G4733 Obstructive sleep apnea (adult) (pediatric): Secondary | ICD-10-CM | POA: Diagnosis not present

## 2020-01-06 DIAGNOSIS — R2689 Other abnormalities of gait and mobility: Secondary | ICD-10-CM | POA: Diagnosis not present

## 2020-01-06 DIAGNOSIS — F419 Anxiety disorder, unspecified: Secondary | ICD-10-CM | POA: Diagnosis not present

## 2020-01-06 DIAGNOSIS — Z9989 Dependence on other enabling machines and devices: Secondary | ICD-10-CM | POA: Diagnosis not present

## 2020-01-07 DIAGNOSIS — Z9989 Dependence on other enabling machines and devices: Secondary | ICD-10-CM | POA: Diagnosis not present

## 2020-01-07 DIAGNOSIS — I10 Essential (primary) hypertension: Secondary | ICD-10-CM | POA: Diagnosis not present

## 2020-01-07 DIAGNOSIS — F419 Anxiety disorder, unspecified: Secondary | ICD-10-CM | POA: Diagnosis not present

## 2020-01-07 DIAGNOSIS — S72001D Fracture of unspecified part of neck of right femur, subsequent encounter for closed fracture with routine healing: Secondary | ICD-10-CM | POA: Diagnosis not present

## 2020-01-07 DIAGNOSIS — G4733 Obstructive sleep apnea (adult) (pediatric): Secondary | ICD-10-CM | POA: Diagnosis not present

## 2020-01-07 DIAGNOSIS — S52501S Unspecified fracture of the lower end of right radius, sequela: Secondary | ICD-10-CM | POA: Diagnosis not present

## 2020-01-07 DIAGNOSIS — I4510 Unspecified right bundle-branch block: Secondary | ICD-10-CM | POA: Diagnosis not present

## 2020-01-10 DIAGNOSIS — S72001D Fracture of unspecified part of neck of right femur, subsequent encounter for closed fracture with routine healing: Secondary | ICD-10-CM | POA: Diagnosis not present

## 2020-01-10 DIAGNOSIS — S52501D Unspecified fracture of the lower end of right radius, subsequent encounter for closed fracture with routine healing: Secondary | ICD-10-CM | POA: Diagnosis not present

## 2020-01-10 DIAGNOSIS — Z9989 Dependence on other enabling machines and devices: Secondary | ICD-10-CM | POA: Diagnosis not present

## 2020-01-10 DIAGNOSIS — G4733 Obstructive sleep apnea (adult) (pediatric): Secondary | ICD-10-CM | POA: Diagnosis not present

## 2020-01-13 DIAGNOSIS — M25551 Pain in right hip: Secondary | ICD-10-CM | POA: Diagnosis not present

## 2020-01-13 DIAGNOSIS — M25531 Pain in right wrist: Secondary | ICD-10-CM | POA: Diagnosis not present

## 2020-01-16 DIAGNOSIS — F419 Anxiety disorder, unspecified: Secondary | ICD-10-CM | POA: Diagnosis not present

## 2020-01-16 DIAGNOSIS — I10 Essential (primary) hypertension: Secondary | ICD-10-CM | POA: Diagnosis not present

## 2020-01-16 DIAGNOSIS — Z7982 Long term (current) use of aspirin: Secondary | ICD-10-CM | POA: Diagnosis not present

## 2020-01-16 DIAGNOSIS — H2513 Age-related nuclear cataract, bilateral: Secondary | ICD-10-CM | POA: Diagnosis not present

## 2020-01-16 DIAGNOSIS — S52501D Unspecified fracture of the lower end of right radius, subsequent encounter for closed fracture with routine healing: Secondary | ICD-10-CM | POA: Diagnosis not present

## 2020-01-16 DIAGNOSIS — S72011D Unspecified intracapsular fracture of right femur, subsequent encounter for closed fracture with routine healing: Secondary | ICD-10-CM | POA: Diagnosis not present

## 2020-01-16 DIAGNOSIS — F339 Major depressive disorder, recurrent, unspecified: Secondary | ICD-10-CM | POA: Diagnosis not present

## 2020-01-16 DIAGNOSIS — G4733 Obstructive sleep apnea (adult) (pediatric): Secondary | ICD-10-CM | POA: Diagnosis not present

## 2020-01-16 DIAGNOSIS — I4510 Unspecified right bundle-branch block: Secondary | ICD-10-CM | POA: Diagnosis not present

## 2020-01-18 DIAGNOSIS — I4510 Unspecified right bundle-branch block: Secondary | ICD-10-CM | POA: Diagnosis not present

## 2020-01-18 DIAGNOSIS — S52501D Unspecified fracture of the lower end of right radius, subsequent encounter for closed fracture with routine healing: Secondary | ICD-10-CM | POA: Diagnosis not present

## 2020-01-18 DIAGNOSIS — S72011D Unspecified intracapsular fracture of right femur, subsequent encounter for closed fracture with routine healing: Secondary | ICD-10-CM | POA: Diagnosis not present

## 2020-01-18 DIAGNOSIS — I10 Essential (primary) hypertension: Secondary | ICD-10-CM | POA: Diagnosis not present

## 2020-01-18 DIAGNOSIS — Z7982 Long term (current) use of aspirin: Secondary | ICD-10-CM | POA: Diagnosis not present

## 2020-01-18 DIAGNOSIS — G4733 Obstructive sleep apnea (adult) (pediatric): Secondary | ICD-10-CM | POA: Diagnosis not present

## 2020-01-18 DIAGNOSIS — F419 Anxiety disorder, unspecified: Secondary | ICD-10-CM | POA: Diagnosis not present

## 2020-01-18 DIAGNOSIS — F339 Major depressive disorder, recurrent, unspecified: Secondary | ICD-10-CM | POA: Diagnosis not present

## 2020-01-18 DIAGNOSIS — H2513 Age-related nuclear cataract, bilateral: Secondary | ICD-10-CM | POA: Diagnosis not present

## 2020-01-20 DIAGNOSIS — M25621 Stiffness of right elbow, not elsewhere classified: Secondary | ICD-10-CM | POA: Diagnosis not present

## 2020-01-20 DIAGNOSIS — M25631 Stiffness of right wrist, not elsewhere classified: Secondary | ICD-10-CM | POA: Diagnosis not present

## 2020-01-20 DIAGNOSIS — R600 Localized edema: Secondary | ICD-10-CM | POA: Diagnosis not present

## 2020-01-20 DIAGNOSIS — M25531 Pain in right wrist: Secondary | ICD-10-CM | POA: Diagnosis not present

## 2020-01-27 DIAGNOSIS — R29898 Other symptoms and signs involving the musculoskeletal system: Secondary | ICD-10-CM | POA: Diagnosis not present

## 2020-01-27 DIAGNOSIS — M25531 Pain in right wrist: Secondary | ICD-10-CM | POA: Diagnosis not present

## 2020-01-27 DIAGNOSIS — M25631 Stiffness of right wrist, not elsewhere classified: Secondary | ICD-10-CM | POA: Diagnosis not present

## 2020-02-02 DIAGNOSIS — F419 Anxiety disorder, unspecified: Secondary | ICD-10-CM | POA: Diagnosis not present

## 2020-02-02 DIAGNOSIS — S72011D Unspecified intracapsular fracture of right femur, subsequent encounter for closed fracture with routine healing: Secondary | ICD-10-CM | POA: Diagnosis not present

## 2020-02-02 DIAGNOSIS — S52501D Unspecified fracture of the lower end of right radius, subsequent encounter for closed fracture with routine healing: Secondary | ICD-10-CM | POA: Diagnosis not present

## 2020-02-02 DIAGNOSIS — I10 Essential (primary) hypertension: Secondary | ICD-10-CM | POA: Diagnosis not present

## 2020-02-04 DIAGNOSIS — M25531 Pain in right wrist: Secondary | ICD-10-CM | POA: Diagnosis not present

## 2020-02-09 DIAGNOSIS — H401331 Pigmentary glaucoma, bilateral, mild stage: Secondary | ICD-10-CM | POA: Diagnosis not present

## 2020-02-09 DIAGNOSIS — Z961 Presence of intraocular lens: Secondary | ICD-10-CM | POA: Diagnosis not present

## 2020-02-09 DIAGNOSIS — H401332 Pigmentary glaucoma, bilateral, moderate stage: Secondary | ICD-10-CM | POA: Diagnosis not present

## 2020-02-10 DIAGNOSIS — M25531 Pain in right wrist: Secondary | ICD-10-CM | POA: Diagnosis not present

## 2020-02-11 DIAGNOSIS — M25531 Pain in right wrist: Secondary | ICD-10-CM | POA: Diagnosis not present

## 2020-02-17 DIAGNOSIS — M25631 Stiffness of right wrist, not elsewhere classified: Secondary | ICD-10-CM | POA: Diagnosis not present

## 2020-02-17 DIAGNOSIS — R29898 Other symptoms and signs involving the musculoskeletal system: Secondary | ICD-10-CM | POA: Diagnosis not present

## 2020-02-17 DIAGNOSIS — M25531 Pain in right wrist: Secondary | ICD-10-CM | POA: Diagnosis not present

## 2020-02-24 DIAGNOSIS — R6889 Other general symptoms and signs: Secondary | ICD-10-CM | POA: Diagnosis not present

## 2020-02-24 DIAGNOSIS — M25551 Pain in right hip: Secondary | ICD-10-CM | POA: Diagnosis not present

## 2020-02-24 DIAGNOSIS — M25631 Stiffness of right wrist, not elsewhere classified: Secondary | ICD-10-CM | POA: Diagnosis not present

## 2020-02-24 DIAGNOSIS — R29898 Other symptoms and signs involving the musculoskeletal system: Secondary | ICD-10-CM | POA: Diagnosis not present

## 2020-02-24 DIAGNOSIS — M25531 Pain in right wrist: Secondary | ICD-10-CM | POA: Diagnosis not present

## 2020-02-29 DIAGNOSIS — M25551 Pain in right hip: Secondary | ICD-10-CM | POA: Diagnosis not present

## 2020-02-29 DIAGNOSIS — R6889 Other general symptoms and signs: Secondary | ICD-10-CM | POA: Diagnosis not present

## 2020-03-02 DIAGNOSIS — M25531 Pain in right wrist: Secondary | ICD-10-CM | POA: Diagnosis not present

## 2020-03-14 DIAGNOSIS — R29898 Other symptoms and signs involving the musculoskeletal system: Secondary | ICD-10-CM | POA: Diagnosis not present

## 2020-03-14 DIAGNOSIS — M25551 Pain in right hip: Secondary | ICD-10-CM | POA: Diagnosis not present

## 2020-03-14 DIAGNOSIS — M25631 Stiffness of right wrist, not elsewhere classified: Secondary | ICD-10-CM | POA: Diagnosis not present

## 2020-03-14 DIAGNOSIS — M25531 Pain in right wrist: Secondary | ICD-10-CM | POA: Diagnosis not present

## 2020-03-14 DIAGNOSIS — R6889 Other general symptoms and signs: Secondary | ICD-10-CM | POA: Diagnosis not present

## 2020-03-21 DIAGNOSIS — I10 Essential (primary) hypertension: Secondary | ICD-10-CM | POA: Diagnosis not present

## 2020-03-21 DIAGNOSIS — R29898 Other symptoms and signs involving the musculoskeletal system: Secondary | ICD-10-CM | POA: Diagnosis not present

## 2020-03-21 DIAGNOSIS — R6889 Other general symptoms and signs: Secondary | ICD-10-CM | POA: Diagnosis not present

## 2020-03-21 DIAGNOSIS — M25531 Pain in right wrist: Secondary | ICD-10-CM | POA: Diagnosis not present

## 2020-03-21 DIAGNOSIS — M25551 Pain in right hip: Secondary | ICD-10-CM | POA: Diagnosis not present

## 2020-03-29 DIAGNOSIS — M25551 Pain in right hip: Secondary | ICD-10-CM | POA: Diagnosis not present

## 2020-03-31 DIAGNOSIS — M25531 Pain in right wrist: Secondary | ICD-10-CM | POA: Diagnosis not present

## 2020-04-10 DIAGNOSIS — I10 Essential (primary) hypertension: Secondary | ICD-10-CM | POA: Diagnosis not present

## 2020-04-13 DIAGNOSIS — R6889 Other general symptoms and signs: Secondary | ICD-10-CM | POA: Diagnosis not present

## 2020-04-13 DIAGNOSIS — M25551 Pain in right hip: Secondary | ICD-10-CM | POA: Diagnosis not present

## 2020-04-13 DIAGNOSIS — M25531 Pain in right wrist: Secondary | ICD-10-CM | POA: Diagnosis not present

## 2020-05-12 DIAGNOSIS — S52501D Unspecified fracture of the lower end of right radius, subsequent encounter for closed fracture with routine healing: Secondary | ICD-10-CM | POA: Diagnosis not present

## 2020-05-12 DIAGNOSIS — S52601D Unspecified fracture of lower end of right ulna, subsequent encounter for closed fracture with routine healing: Secondary | ICD-10-CM | POA: Diagnosis not present

## 2020-05-12 DIAGNOSIS — M25531 Pain in right wrist: Secondary | ICD-10-CM | POA: Diagnosis not present

## 2020-05-12 DIAGNOSIS — S6981XS Other specified injuries of right wrist, hand and finger(s), sequela: Secondary | ICD-10-CM | POA: Diagnosis not present

## 2020-06-13 DIAGNOSIS — Z85828 Personal history of other malignant neoplasm of skin: Secondary | ICD-10-CM | POA: Diagnosis not present

## 2020-06-13 DIAGNOSIS — L820 Inflamed seborrheic keratosis: Secondary | ICD-10-CM | POA: Diagnosis not present

## 2020-06-13 DIAGNOSIS — L218 Other seborrheic dermatitis: Secondary | ICD-10-CM | POA: Diagnosis not present

## 2020-06-13 DIAGNOSIS — D225 Melanocytic nevi of trunk: Secondary | ICD-10-CM | POA: Diagnosis not present

## 2020-06-13 DIAGNOSIS — L570 Actinic keratosis: Secondary | ICD-10-CM | POA: Diagnosis not present

## 2020-06-13 DIAGNOSIS — Z08 Encounter for follow-up examination after completed treatment for malignant neoplasm: Secondary | ICD-10-CM | POA: Diagnosis not present

## 2020-06-13 DIAGNOSIS — X32XXXD Exposure to sunlight, subsequent encounter: Secondary | ICD-10-CM | POA: Diagnosis not present

## 2020-06-14 DIAGNOSIS — H401332 Pigmentary glaucoma, bilateral, moderate stage: Secondary | ICD-10-CM | POA: Diagnosis not present

## 2020-06-14 DIAGNOSIS — Z961 Presence of intraocular lens: Secondary | ICD-10-CM | POA: Diagnosis not present

## 2020-09-14 DIAGNOSIS — N401 Enlarged prostate with lower urinary tract symptoms: Secondary | ICD-10-CM | POA: Diagnosis not present

## 2020-09-21 DIAGNOSIS — R3915 Urgency of urination: Secondary | ICD-10-CM | POA: Diagnosis not present

## 2020-09-21 DIAGNOSIS — N529 Male erectile dysfunction, unspecified: Secondary | ICD-10-CM | POA: Diagnosis not present

## 2020-09-21 DIAGNOSIS — Z8546 Personal history of malignant neoplasm of prostate: Secondary | ICD-10-CM | POA: Diagnosis not present

## 2020-10-31 DIAGNOSIS — S52601D Unspecified fracture of lower end of right ulna, subsequent encounter for closed fracture with routine healing: Secondary | ICD-10-CM | POA: Diagnosis not present

## 2020-10-31 DIAGNOSIS — S52501D Unspecified fracture of the lower end of right radius, subsequent encounter for closed fracture with routine healing: Secondary | ICD-10-CM | POA: Diagnosis not present

## 2020-11-08 DIAGNOSIS — I351 Nonrheumatic aortic (valve) insufficiency: Secondary | ICD-10-CM | POA: Diagnosis not present

## 2020-11-09 DIAGNOSIS — I351 Nonrheumatic aortic (valve) insufficiency: Secondary | ICD-10-CM | POA: Diagnosis not present

## 2020-11-15 DIAGNOSIS — Z125 Encounter for screening for malignant neoplasm of prostate: Secondary | ICD-10-CM | POA: Diagnosis not present

## 2020-11-15 DIAGNOSIS — I10 Essential (primary) hypertension: Secondary | ICD-10-CM | POA: Diagnosis not present

## 2020-11-20 DIAGNOSIS — Z8659 Personal history of other mental and behavioral disorders: Secondary | ICD-10-CM | POA: Diagnosis not present

## 2020-11-20 DIAGNOSIS — K219 Gastro-esophageal reflux disease without esophagitis: Secondary | ICD-10-CM | POA: Diagnosis not present

## 2020-11-20 DIAGNOSIS — G4733 Obstructive sleep apnea (adult) (pediatric): Secondary | ICD-10-CM | POA: Diagnosis not present

## 2020-11-20 DIAGNOSIS — I10 Essential (primary) hypertension: Secondary | ICD-10-CM | POA: Diagnosis not present

## 2020-11-20 DIAGNOSIS — D61818 Other pancytopenia: Secondary | ICD-10-CM | POA: Diagnosis not present

## 2020-11-20 DIAGNOSIS — I351 Nonrheumatic aortic (valve) insufficiency: Secondary | ICD-10-CM | POA: Diagnosis not present

## 2020-11-20 DIAGNOSIS — Z0001 Encounter for general adult medical examination with abnormal findings: Secondary | ICD-10-CM | POA: Diagnosis not present

## 2020-11-20 DIAGNOSIS — N529 Male erectile dysfunction, unspecified: Secondary | ICD-10-CM | POA: Diagnosis not present

## 2020-11-20 DIAGNOSIS — E7800 Pure hypercholesterolemia, unspecified: Secondary | ICD-10-CM | POA: Diagnosis not present

## 2020-12-13 DIAGNOSIS — Z961 Presence of intraocular lens: Secondary | ICD-10-CM | POA: Diagnosis not present

## 2020-12-13 DIAGNOSIS — H401332 Pigmentary glaucoma, bilateral, moderate stage: Secondary | ICD-10-CM | POA: Diagnosis not present

## 2020-12-13 DIAGNOSIS — D759 Disease of blood and blood-forming organs, unspecified: Secondary | ICD-10-CM | POA: Diagnosis not present

## 2020-12-13 DIAGNOSIS — D61818 Other pancytopenia: Secondary | ICD-10-CM | POA: Diagnosis not present

## 2021-01-02 DIAGNOSIS — M25551 Pain in right hip: Secondary | ICD-10-CM | POA: Diagnosis not present

## 2021-01-23 DIAGNOSIS — B9689 Other specified bacterial agents as the cause of diseases classified elsewhere: Secondary | ICD-10-CM | POA: Diagnosis not present

## 2021-01-23 DIAGNOSIS — X32XXXD Exposure to sunlight, subsequent encounter: Secondary | ICD-10-CM | POA: Diagnosis not present

## 2021-01-23 DIAGNOSIS — L570 Actinic keratosis: Secondary | ICD-10-CM | POA: Diagnosis not present

## 2021-01-23 DIAGNOSIS — L0202 Furuncle of face: Secondary | ICD-10-CM | POA: Diagnosis not present

## 2021-03-27 DIAGNOSIS — L570 Actinic keratosis: Secondary | ICD-10-CM | POA: Diagnosis not present

## 2021-03-27 DIAGNOSIS — X32XXXD Exposure to sunlight, subsequent encounter: Secondary | ICD-10-CM | POA: Diagnosis not present

## 2021-04-23 DIAGNOSIS — Z961 Presence of intraocular lens: Secondary | ICD-10-CM | POA: Diagnosis not present

## 2021-04-23 DIAGNOSIS — H524 Presbyopia: Secondary | ICD-10-CM | POA: Diagnosis not present

## 2021-04-23 DIAGNOSIS — H52223 Regular astigmatism, bilateral: Secondary | ICD-10-CM | POA: Diagnosis not present

## 2021-04-27 DIAGNOSIS — N4340 Spermatocele of epididymis, unspecified: Secondary | ICD-10-CM | POA: Diagnosis not present

## 2021-04-27 DIAGNOSIS — Z8546 Personal history of malignant neoplasm of prostate: Secondary | ICD-10-CM | POA: Diagnosis not present

## 2021-04-27 DIAGNOSIS — N529 Male erectile dysfunction, unspecified: Secondary | ICD-10-CM | POA: Diagnosis not present

## 2021-05-01 ENCOUNTER — Ambulatory Visit
Admission: RE | Admit: 2021-05-01 | Discharge: 2021-05-01 | Disposition: A | Discharge status: Home / Self Care | Payer: Medicare Other | Source: Ambulatory Visit | Attending: Urology | Admitting: Urology

## 2021-05-01 ENCOUNTER — Other Ambulatory Visit: Payer: Self-pay | Admitting: Urology

## 2021-05-01 DIAGNOSIS — N4340 Spermatocele of epididymis, unspecified: Secondary | ICD-10-CM

## 2021-05-01 DIAGNOSIS — N503 Cyst of epididymis: Secondary | ICD-10-CM | POA: Diagnosis not present

## 2021-05-01 DIAGNOSIS — N4341 Spermatocele of epididymis, single: Secondary | ICD-10-CM | POA: Diagnosis not present

## 2021-05-09 DIAGNOSIS — N4340 Spermatocele of epididymis, unspecified: Secondary | ICD-10-CM | POA: Diagnosis not present

## 2021-05-09 DIAGNOSIS — Z8546 Personal history of malignant neoplasm of prostate: Secondary | ICD-10-CM | POA: Diagnosis not present

## 2021-05-09 DIAGNOSIS — N529 Male erectile dysfunction, unspecified: Secondary | ICD-10-CM | POA: Diagnosis not present

## 2021-05-10 DIAGNOSIS — D759 Disease of blood and blood-forming organs, unspecified: Secondary | ICD-10-CM | POA: Diagnosis not present

## 2021-05-10 DIAGNOSIS — F419 Anxiety disorder, unspecified: Secondary | ICD-10-CM | POA: Diagnosis not present

## 2021-06-13 DIAGNOSIS — L820 Inflamed seborrheic keratosis: Secondary | ICD-10-CM | POA: Diagnosis not present

## 2021-06-13 DIAGNOSIS — Z08 Encounter for follow-up examination after completed treatment for malignant neoplasm: Secondary | ICD-10-CM | POA: Diagnosis not present

## 2021-06-13 DIAGNOSIS — X32XXXD Exposure to sunlight, subsequent encounter: Secondary | ICD-10-CM | POA: Diagnosis not present

## 2021-06-13 DIAGNOSIS — Z1283 Encounter for screening for malignant neoplasm of skin: Secondary | ICD-10-CM | POA: Diagnosis not present

## 2021-06-13 DIAGNOSIS — Z85828 Personal history of other malignant neoplasm of skin: Secondary | ICD-10-CM | POA: Diagnosis not present

## 2021-06-13 DIAGNOSIS — L570 Actinic keratosis: Secondary | ICD-10-CM | POA: Diagnosis not present

## 2021-06-13 DIAGNOSIS — D225 Melanocytic nevi of trunk: Secondary | ICD-10-CM | POA: Diagnosis not present

## 2021-06-15 DIAGNOSIS — Z961 Presence of intraocular lens: Secondary | ICD-10-CM | POA: Diagnosis not present

## 2021-06-15 DIAGNOSIS — H401332 Pigmentary glaucoma, bilateral, moderate stage: Secondary | ICD-10-CM | POA: Diagnosis not present

## 2021-07-03 DIAGNOSIS — Z09 Encounter for follow-up examination after completed treatment for conditions other than malignant neoplasm: Secondary | ICD-10-CM | POA: Diagnosis not present

## 2021-07-03 DIAGNOSIS — K635 Polyp of colon: Secondary | ICD-10-CM | POA: Diagnosis not present

## 2021-07-03 DIAGNOSIS — Z86010 Personal history of colonic polyps: Secondary | ICD-10-CM | POA: Diagnosis not present

## 2021-07-03 DIAGNOSIS — K6389 Other specified diseases of intestine: Secondary | ICD-10-CM | POA: Diagnosis not present

## 2021-07-03 DIAGNOSIS — K621 Rectal polyp: Secondary | ICD-10-CM | POA: Diagnosis not present

## 2021-07-03 DIAGNOSIS — D128 Benign neoplasm of rectum: Secondary | ICD-10-CM | POA: Diagnosis not present

## 2021-07-06 DIAGNOSIS — B356 Tinea cruris: Secondary | ICD-10-CM | POA: Diagnosis not present

## 2021-07-06 DIAGNOSIS — I10 Essential (primary) hypertension: Secondary | ICD-10-CM | POA: Diagnosis not present

## 2021-08-06 DIAGNOSIS — Q396 Congenital diverticulum of esophagus: Secondary | ICD-10-CM | POA: Diagnosis not present

## 2021-08-06 DIAGNOSIS — Z13810 Encounter for screening for upper gastrointestinal disorder: Secondary | ICD-10-CM | POA: Diagnosis not present

## 2021-09-27 DIAGNOSIS — C61 Malignant neoplasm of prostate: Secondary | ICD-10-CM | POA: Diagnosis not present

## 2021-11-28 ENCOUNTER — Other Ambulatory Visit (HOSPITAL_COMMUNITY): Payer: Self-pay | Admitting: Internal Medicine

## 2021-11-28 DIAGNOSIS — I10 Essential (primary) hypertension: Secondary | ICD-10-CM

## 2021-12-03 ENCOUNTER — Ambulatory Visit (INDEPENDENT_AMBULATORY_CARE_PROVIDER_SITE_OTHER): Payer: Self-pay

## 2021-12-03 DIAGNOSIS — I10 Essential (primary) hypertension: Secondary | ICD-10-CM

## 2021-12-26 DIAGNOSIS — L570 Actinic keratosis: Secondary | ICD-10-CM | POA: Diagnosis not present

## 2021-12-26 DIAGNOSIS — B078 Other viral warts: Secondary | ICD-10-CM | POA: Diagnosis not present

## 2021-12-26 DIAGNOSIS — X32XXXD Exposure to sunlight, subsequent encounter: Secondary | ICD-10-CM | POA: Diagnosis not present

## 2021-12-26 DIAGNOSIS — L821 Other seborrheic keratosis: Secondary | ICD-10-CM | POA: Diagnosis not present

## 2021-12-26 DIAGNOSIS — D225 Melanocytic nevi of trunk: Secondary | ICD-10-CM | POA: Diagnosis not present

## 2022-06-12 DIAGNOSIS — L218 Other seborrheic dermatitis: Secondary | ICD-10-CM | POA: Diagnosis not present

## 2022-06-12 DIAGNOSIS — X32XXXD Exposure to sunlight, subsequent encounter: Secondary | ICD-10-CM | POA: Diagnosis not present

## 2022-06-12 DIAGNOSIS — L570 Actinic keratosis: Secondary | ICD-10-CM | POA: Diagnosis not present

## 2022-06-12 DIAGNOSIS — L821 Other seborrheic keratosis: Secondary | ICD-10-CM | POA: Diagnosis not present

## 2022-09-04 DIAGNOSIS — M791 Myalgia, unspecified site: Secondary | ICD-10-CM | POA: Diagnosis not present

## 2022-09-04 DIAGNOSIS — Z6825 Body mass index (BMI) 25.0-25.9, adult: Secondary | ICD-10-CM | POA: Diagnosis not present

## 2022-09-04 DIAGNOSIS — Z03818 Encounter for observation for suspected exposure to other biological agents ruled out: Secondary | ICD-10-CM | POA: Diagnosis not present

## 2022-09-27 DIAGNOSIS — R399 Unspecified symptoms and signs involving the genitourinary system: Secondary | ICD-10-CM | POA: Diagnosis not present

## 2022-09-27 DIAGNOSIS — C61 Malignant neoplasm of prostate: Secondary | ICD-10-CM | POA: Diagnosis not present

## 2022-11-13 DIAGNOSIS — D61818 Other pancytopenia: Secondary | ICD-10-CM | POA: Diagnosis not present

## 2022-11-13 DIAGNOSIS — D759 Disease of blood and blood-forming organs, unspecified: Secondary | ICD-10-CM | POA: Diagnosis not present

## 2022-11-26 DIAGNOSIS — I10 Essential (primary) hypertension: Secondary | ICD-10-CM | POA: Diagnosis not present

## 2022-11-26 DIAGNOSIS — Z125 Encounter for screening for malignant neoplasm of prostate: Secondary | ICD-10-CM | POA: Diagnosis not present

## 2022-11-28 DIAGNOSIS — J309 Allergic rhinitis, unspecified: Secondary | ICD-10-CM | POA: Diagnosis not present

## 2022-11-28 DIAGNOSIS — N529 Male erectile dysfunction, unspecified: Secondary | ICD-10-CM | POA: Diagnosis not present

## 2022-11-28 DIAGNOSIS — D696 Thrombocytopenia, unspecified: Secondary | ICD-10-CM | POA: Diagnosis not present

## 2022-11-28 DIAGNOSIS — Z0000 Encounter for general adult medical examination without abnormal findings: Secondary | ICD-10-CM | POA: Diagnosis not present

## 2022-11-28 DIAGNOSIS — K219 Gastro-esophageal reflux disease without esophagitis: Secondary | ICD-10-CM | POA: Diagnosis not present

## 2022-11-28 DIAGNOSIS — I10 Essential (primary) hypertension: Secondary | ICD-10-CM | POA: Diagnosis not present

## 2022-11-28 DIAGNOSIS — Z8659 Personal history of other mental and behavioral disorders: Secondary | ICD-10-CM | POA: Diagnosis not present

## 2022-11-28 DIAGNOSIS — G4733 Obstructive sleep apnea (adult) (pediatric): Secondary | ICD-10-CM | POA: Diagnosis not present

## 2023-02-07 DIAGNOSIS — X32XXXD Exposure to sunlight, subsequent encounter: Secondary | ICD-10-CM | POA: Diagnosis not present

## 2023-02-07 DIAGNOSIS — D225 Melanocytic nevi of trunk: Secondary | ICD-10-CM | POA: Diagnosis not present

## 2023-02-07 DIAGNOSIS — Z1283 Encounter for screening for malignant neoplasm of skin: Secondary | ICD-10-CM | POA: Diagnosis not present

## 2023-02-07 DIAGNOSIS — C44329 Squamous cell carcinoma of skin of other parts of face: Secondary | ICD-10-CM | POA: Diagnosis not present

## 2023-02-07 DIAGNOSIS — L570 Actinic keratosis: Secondary | ICD-10-CM | POA: Diagnosis not present

## 2023-02-07 DIAGNOSIS — D485 Neoplasm of uncertain behavior of skin: Secondary | ICD-10-CM | POA: Diagnosis not present

## 2023-02-26 DIAGNOSIS — H401332 Pigmentary glaucoma, bilateral, moderate stage: Secondary | ICD-10-CM | POA: Diagnosis not present

## 2023-02-26 DIAGNOSIS — Z961 Presence of intraocular lens: Secondary | ICD-10-CM | POA: Diagnosis not present

## 2023-02-26 DIAGNOSIS — H35373 Puckering of macula, bilateral: Secondary | ICD-10-CM | POA: Diagnosis not present

## 2023-02-26 DIAGNOSIS — H35372 Puckering of macula, left eye: Secondary | ICD-10-CM | POA: Diagnosis not present

## 2023-03-11 DIAGNOSIS — M25511 Pain in right shoulder: Secondary | ICD-10-CM | POA: Diagnosis not present

## 2023-03-11 DIAGNOSIS — M7541 Impingement syndrome of right shoulder: Secondary | ICD-10-CM | POA: Diagnosis not present

## 2023-03-11 DIAGNOSIS — M7551 Bursitis of right shoulder: Secondary | ICD-10-CM | POA: Diagnosis not present

## 2023-04-08 DIAGNOSIS — X32XXXD Exposure to sunlight, subsequent encounter: Secondary | ICD-10-CM | POA: Diagnosis not present

## 2023-04-08 DIAGNOSIS — L570 Actinic keratosis: Secondary | ICD-10-CM | POA: Diagnosis not present

## 2023-04-08 DIAGNOSIS — Z08 Encounter for follow-up examination after completed treatment for malignant neoplasm: Secondary | ICD-10-CM | POA: Diagnosis not present

## 2023-04-08 DIAGNOSIS — Z85828 Personal history of other malignant neoplasm of skin: Secondary | ICD-10-CM | POA: Diagnosis not present

## 2023-05-21 DIAGNOSIS — D759 Disease of blood and blood-forming organs, unspecified: Secondary | ICD-10-CM | POA: Diagnosis not present

## 2023-05-21 DIAGNOSIS — D508 Other iron deficiency anemias: Secondary | ICD-10-CM | POA: Diagnosis not present

## 2023-05-21 DIAGNOSIS — D61818 Other pancytopenia: Secondary | ICD-10-CM | POA: Diagnosis not present

## 2023-06-10 DIAGNOSIS — M7551 Bursitis of right shoulder: Secondary | ICD-10-CM | POA: Diagnosis not present

## 2023-06-12 DIAGNOSIS — M7551 Bursitis of right shoulder: Secondary | ICD-10-CM | POA: Diagnosis not present

## 2023-06-16 DIAGNOSIS — R490 Dysphonia: Secondary | ICD-10-CM | POA: Diagnosis not present

## 2023-06-16 DIAGNOSIS — K219 Gastro-esophageal reflux disease without esophagitis: Secondary | ICD-10-CM | POA: Diagnosis not present

## 2023-06-17 DIAGNOSIS — M7551 Bursitis of right shoulder: Secondary | ICD-10-CM | POA: Diagnosis not present

## 2023-06-23 DIAGNOSIS — M7551 Bursitis of right shoulder: Secondary | ICD-10-CM | POA: Diagnosis not present

## 2023-06-30 DIAGNOSIS — M7551 Bursitis of right shoulder: Secondary | ICD-10-CM | POA: Diagnosis not present

## 2023-07-07 DIAGNOSIS — M7551 Bursitis of right shoulder: Secondary | ICD-10-CM | POA: Diagnosis not present

## 2023-07-14 DIAGNOSIS — M7551 Bursitis of right shoulder: Secondary | ICD-10-CM | POA: Diagnosis not present

## 2023-07-21 DIAGNOSIS — M7551 Bursitis of right shoulder: Secondary | ICD-10-CM | POA: Diagnosis not present

## 2023-07-28 DIAGNOSIS — M7551 Bursitis of right shoulder: Secondary | ICD-10-CM | POA: Diagnosis not present

## 2023-08-04 DIAGNOSIS — M7551 Bursitis of right shoulder: Secondary | ICD-10-CM | POA: Diagnosis not present

## 2023-11-11 IMAGING — CT CT CARDIAC CORONARY ARTERY CALCIUM SCORE
3 series · 14 of 20 positions shown, 16 images · non-contrast
Comparison: None.
COMPARISON: None.

Addendum:
EXAM:
OVER-READ INTERPRETATION  CT CHEST

The following report is an over-read performed by radiologist Dr.
Nagii Ar [REDACTED] on 12/03/2021. This
over-read does not include interpretation of cardiac or coronary
anatomy or pathology. The coronary calcium score/coronary CTA
interpretation by the cardiologist is attached.
CLINICAL DATA: 75F for cardiovascular disease risk stratification
Coronary Calcium Score
TECHNIQUE: A gated, non-contrast computed tomography scan of the heart was
performed using 3mm slice thickness. Axial images were analyzed on a
dedicated workstation. Calcium scoring of the coronary arteries was
performed using the Agatston method.

[Series 2: cascseq 3.0 sa36 70% (id) · axial · 0.39mm/px · z∈[-312,-228]mm · 4 of 48 slices shown]
[im 10/48  vessel]
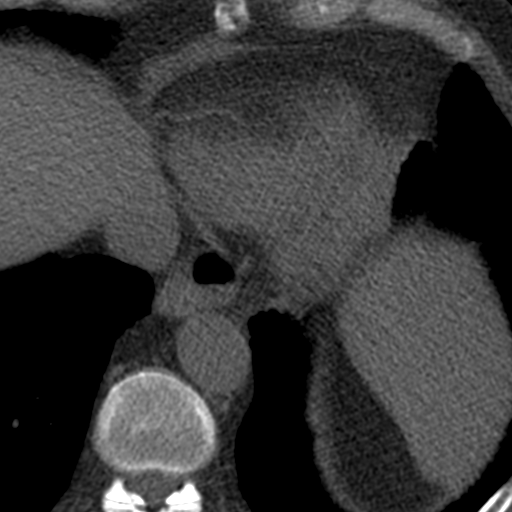
[im 19/48  vessel]
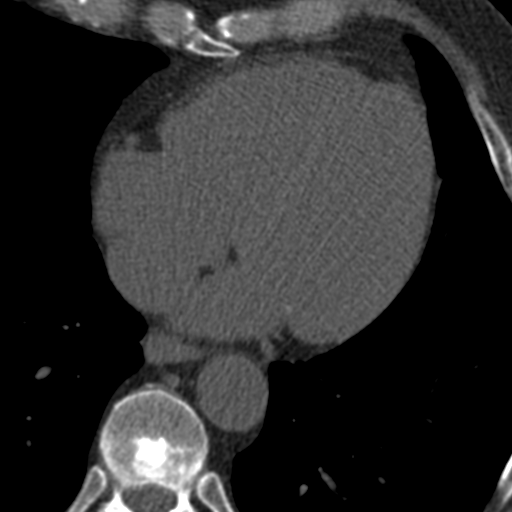
[im 29/48  vessel]
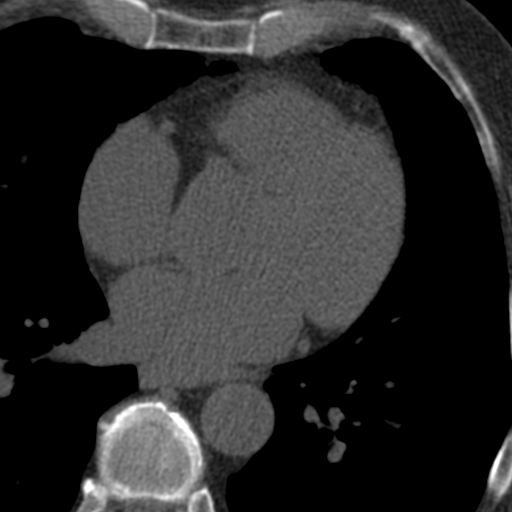
[im 38/48  vessel]
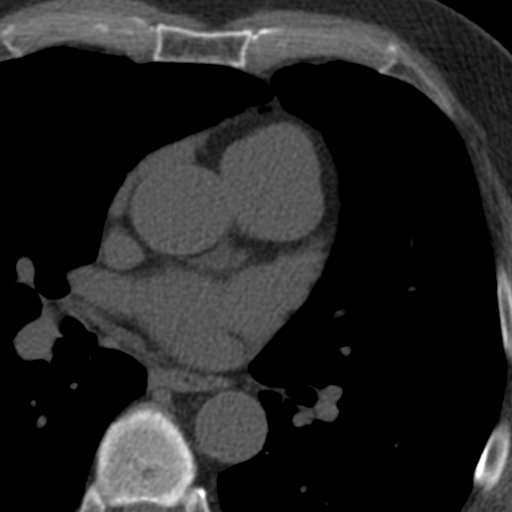

[Series 3: full fov st (id) · axial · 0.74mm/px · z∈[-318,-222]mm · 5 of 48 slices shown, 7 images]
[im 8/48  vessel]
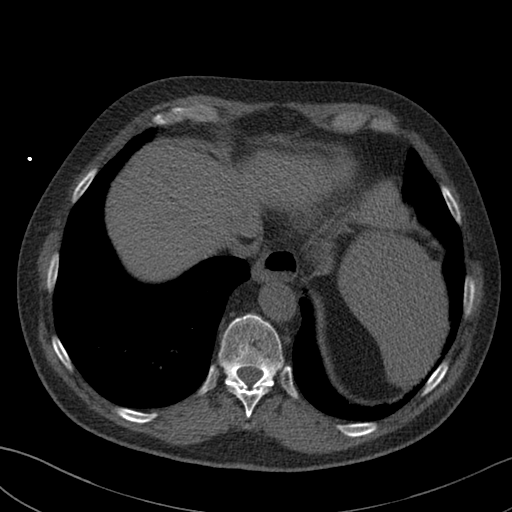
[im 8/48  lung]
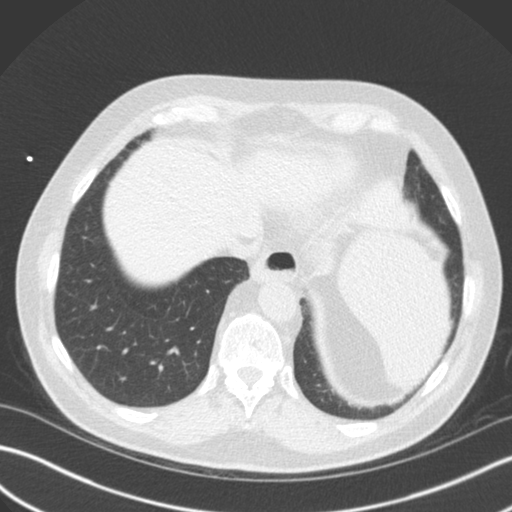
[im 16/48  vessel]
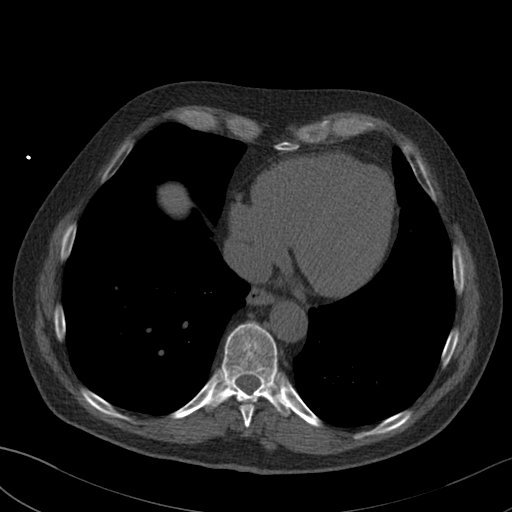
[im 24/48  vessel]
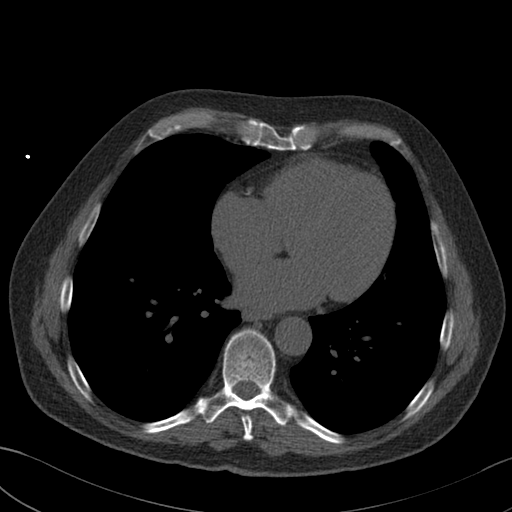
[im 32/48  vessel]
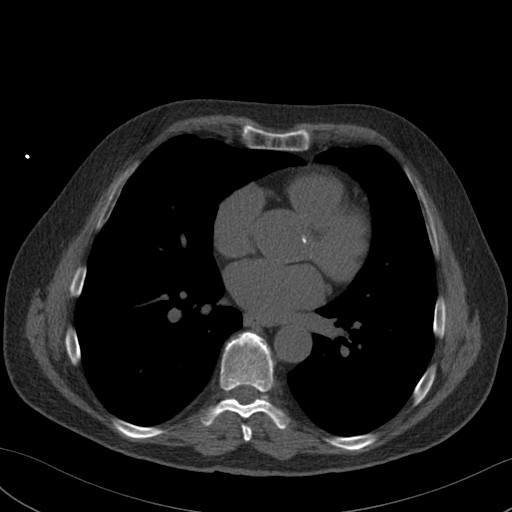
[im 40/48  vessel]
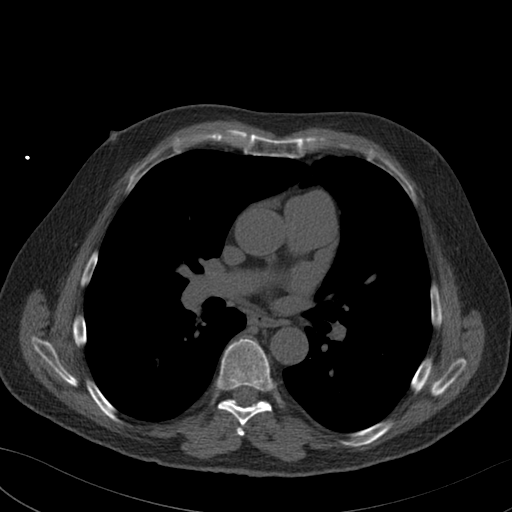
[im 40/48  lung]
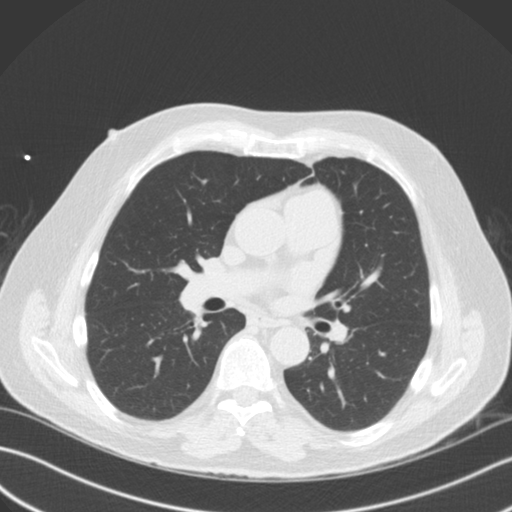

[Series 4: full fov lung · axial · 0.74mm/px · z∈[-318,-222]mm · 5 of 48 slices shown]
[im 8/48  lung]
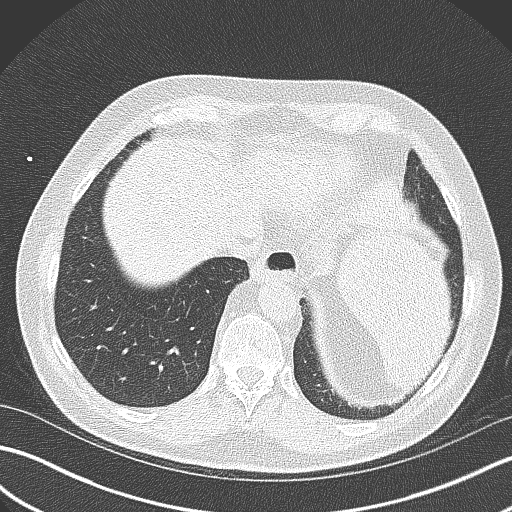
[im 16/48  lung]
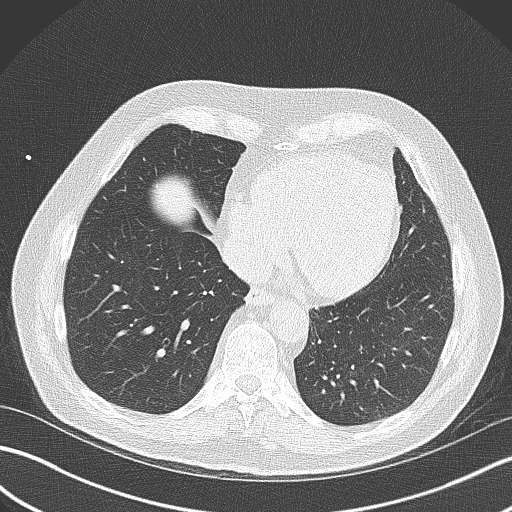
[im 24/48  lung]
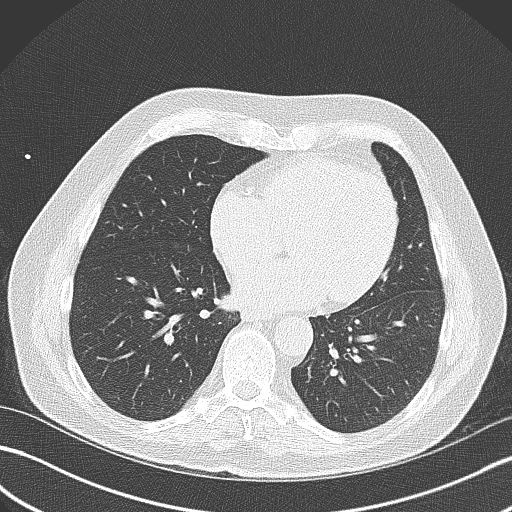
[im 32/48  lung]
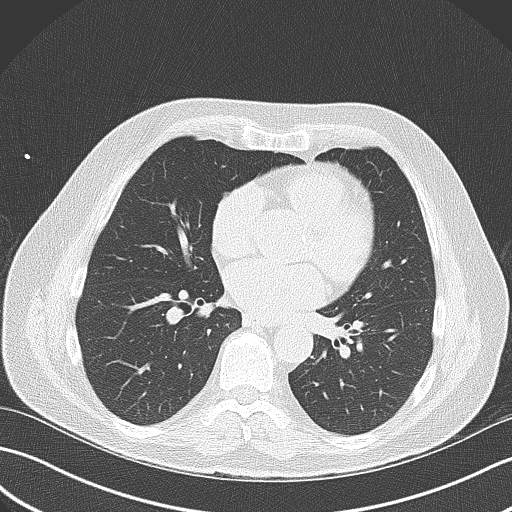
[im 40/48  lung]
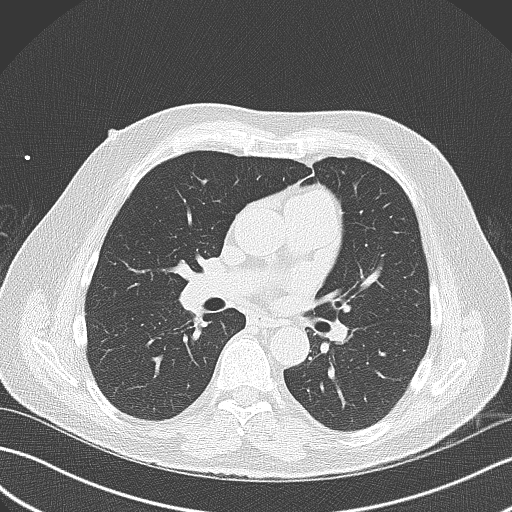

[14 of 20 positions shown; findings below may reference images not displayed]

FINDINGS: Limited view of the lung parenchyma demonstrates no suspicious
nodularity. Airways are normal.

Limited view of the mediastinum demonstrates no adenopathy.
Esophagus normal.

Limited view of the upper abdomen unremarkable.

Limited view of the skeleton and chest wall is unremarkable.
IMPRESSION: No significant extracardiac findings.
FINDINGS: Coronary arteries: Normal origins.

Coronary Calcium Score: 0

Percentile: 0

Pericardium: Normal.

Ascending Aorta: Normal caliber. 3.2 cm. Aortic root
atherosclerosis.

Non-cardiac: See separate report from [REDACTED].
IMPRESSION: Coronary calcium score of 0. This was 0 percentile for age-, race-,
and sex-matched controls.



If CAC=0, it is reasonable to withhold statin therapy and reassess
in 5 to 10 years, as long as higher risk conditions are absent
(diabetes mellitus, family history of premature CHD in first degree
relatives (males <55 years; females <65 years), cigarette smoking,
or LDL >=190 mg/dL).

If CAC is 1 to 99, it is reasonable to initiate statin therapy for
patients >=55 years of age.

If CAC is >=100 or >=75th percentile, it is reasonable to initiate
statin therapy at any age.

Cardiology referral should be considered for patients with CAC
scores >=400 or >=75th percentile.

*2103 AHA/ACC/AACVPR/AAPA/ABC/ZIWEI/AZZ/ORBE/Christkanlaya/KENICHIRO/MIR/KLEVER
Guideline on the Management of Blood Cholesterol: A Report of the
American College of Cardiology/American Heart Association Task Force
on Clinical Practice Guidelines. J Am Coll Cardiol.
8906;73(24):0638-0445.

*** End of Addendum ***
EXAM:
OVER-READ INTERPRETATION  CT CHEST

The following report is an over-read performed by radiologist Dr.
Nagii Ar [REDACTED] on 12/03/2021. This
over-read does not include interpretation of cardiac or coronary
anatomy or pathology. The coronary calcium score/coronary CTA
interpretation by the cardiologist is attached.
FINDINGS: Limited view of the lung parenchyma demonstrates no suspicious
nodularity. Airways are normal.

Limited view of the mediastinum demonstrates no adenopathy.
Esophagus normal.

Limited view of the upper abdomen unremarkable.

Limited view of the skeleton and chest wall is unremarkable.
IMPRESSION: No significant extracardiac findings.
